# Patient Record
Sex: Male | Born: 1955 | Race: Black or African American | Hispanic: No | Marital: Single | Smoking: Current every day smoker
Health system: Southern US, Community
[De-identification: ages and names within clinical notes are randomized; demographics above are authoritative.]

## PROBLEM LIST (undated history)

## (undated) DIAGNOSIS — A159 Respiratory tuberculosis unspecified: Secondary | ICD-10-CM

## (undated) DIAGNOSIS — M25569 Pain in unspecified knee: Secondary | ICD-10-CM

## (undated) DIAGNOSIS — M19011 Primary osteoarthritis, right shoulder: Secondary | ICD-10-CM

## (undated) DIAGNOSIS — M1990 Unspecified osteoarthritis, unspecified site: Secondary | ICD-10-CM

## (undated) DIAGNOSIS — B1920 Unspecified viral hepatitis C without hepatic coma: Secondary | ICD-10-CM

## (undated) DIAGNOSIS — M109 Gout, unspecified: Secondary | ICD-10-CM

## (undated) HISTORY — PX: OTHER SURGICAL HISTORY: SHX169

---

## 1987-01-27 DIAGNOSIS — A159 Respiratory tuberculosis unspecified: Secondary | ICD-10-CM

## 1987-01-27 HISTORY — DX: Respiratory tuberculosis unspecified: A15.9

## 2002-04-23 ENCOUNTER — Inpatient Hospital Stay (HOSPITAL_COMMUNITY): Admission: EM | Admit: 2002-04-23 | Discharge: 2002-04-26 | Payer: Self-pay | Admitting: *Deleted

## 2002-11-21 ENCOUNTER — Emergency Department (HOSPITAL_COMMUNITY): Admission: EM | Admit: 2002-11-21 | Discharge: 2002-11-21 | Payer: Self-pay | Admitting: Emergency Medicine

## 2009-12-16 ENCOUNTER — Emergency Department (HOSPITAL_COMMUNITY): Admission: EM | Admit: 2009-12-16 | Discharge: 2009-12-16 | Payer: Self-pay | Admitting: Emergency Medicine

## 2010-01-22 ENCOUNTER — Emergency Department (HOSPITAL_COMMUNITY)
Admission: EM | Admit: 2010-01-22 | Discharge: 2010-01-22 | Payer: Self-pay | Source: Home / Self Care | Admitting: Emergency Medicine

## 2010-02-03 ENCOUNTER — Emergency Department (HOSPITAL_COMMUNITY)
Admission: EM | Admit: 2010-02-03 | Discharge: 2010-02-03 | Payer: Self-pay | Source: Home / Self Care | Admitting: Family Medicine

## 2010-04-08 LAB — CBC
HCT: 44.2 % (ref 39.0–52.0)
Hemoglobin: 14.6 g/dL (ref 13.0–17.0)
MCH: 27.3 pg (ref 26.0–34.0)
MCHC: 33 g/dL (ref 30.0–36.0)
MCV: 82.6 fL (ref 78.0–100.0)
Platelets: 182 10*3/uL (ref 150–400)
RBC: 5.35 MIL/uL (ref 4.22–5.81)
RDW: 13.5 % (ref 11.5–15.5)
WBC: 6.7 10*3/uL (ref 4.0–10.5)

## 2010-04-08 LAB — BASIC METABOLIC PANEL
BUN: 13 mg/dL (ref 6–23)
CO2: 27 mEq/L (ref 19–32)
Calcium: 8.9 mg/dL (ref 8.4–10.5)
Chloride: 99 mEq/L (ref 96–112)
Creatinine, Ser: 1 mg/dL (ref 0.4–1.5)
GFR calc Af Amer: >60 mL/min (ref >60)
GFR calc non Af Amer: >60 mL/min (ref >60)
Glucose, Bld: 104 mg/dL — ABNORMAL HIGH (ref 70–99)
Potassium: 4.3 mEq/L (ref 3.5–5.1)
Sodium: 134 mEq/L — ABNORMAL LOW (ref 135–145)

## 2010-04-08 LAB — DIFFERENTIAL
Basophils Absolute: 0 10*3/uL (ref 0.0–0.1)
Basophils Relative: 0 % (ref 0–1)
Eosinophils Absolute: 0.1 10*3/uL (ref 0.0–0.7)
Eosinophils Relative: 2 % (ref 0–5)
Lymphocytes Relative: 31 % (ref 12–46)
Lymphs Abs: 2.1 10*3/uL (ref 0.7–4.0)
Monocytes Absolute: 0.8 10*3/uL (ref 0.1–1.0)
Monocytes Relative: 12 % (ref 3–12)
Neutro Abs: 3.7 10*3/uL (ref 1.7–7.7)
Neutrophils Relative %: 55 % (ref 43–77)

## 2010-06-13 NOTE — Op Note (Signed)
   NAME:  Keith Deleon, Keith Deleon                           ACCOUNT NO.:  [id number]   MEDICAL RECORD NO.:  [id number]                   PATIENT TYPE:  INP   LOCATION:  5012                                 FACILITY:  [facility]   PHYSICIAN:  Artist Pais. Mina Marble, M.D.           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   PROCEDURE:  Irrigation and debridement of left index finger metacarpal  phalangeal joint.   SURGEON:  Artist Pais. Mina Marble, M.D.   ASSISTANT:  None.   ANESTHESIA:  General.   TOURNIQUET TIME:  Twenty-five minutes.   COMPLICATIONS:  None.   DRAINS:  None.   OPERATIVE REPORT:  The patient was taken to the operating room after  induction of general anesthesia.  The left upper extremity was prepped and  draped in the usual sterile fashion.  The arm was elevated for five minutes  and the tourniquet was inflated to 250 mmHg.  At this point in time a  longitudinal incision was made over the metacarpal phalangeal joint of the  left index finger, the incision was taken down through the skin and  subcutaneous tissues.  The metacarpal phalangeal joint was entered on the  radial side, purulent material was encountered, this was cultured, was  thoroughly irrigated with a liter of normal saline and 500 mm of antibiotic  impregnated in normal saline, it was loosely closed over a Xeroform packing  using 4-0 nylon.  The patient was then placed in a sterile dressing of  Xeroform, 4 x 4's, fluffs and a compressive hand dressing.  The patient  tolerated the procedure well and went to recovery in stable fashion.                                               Artist Pais Mina Marble, M.D.    MAW/MEDQ  D:   09/13/2002  T:  09/13/2002  Job:  [id number]

## 2010-06-13 NOTE — Op Note (Signed)
NAME:  MADDOX, HLAVATY                           ACCOUNT NO.:  [id number]   MEDICAL RECORD NO.:  [id number]                   PATIENT TYPE:  INP   LOCATION:  1827                                 FACILITY:  [facility]   PHYSICIAN:  Artist Pais. Mina Marble, M.D.           DATE OF BIRTH:  [birth year]   DATE OF PROCEDURE:  04/23/2002  DATE OF DISCHARGE:                                 OPERATIVE REPORT   PREOPERATIVE DIAGNOSIS:  Left index finger metacarpophalangeal joint septic  arthritis, status post spider bite.   POSTOPERATIVE DIAGNOSIS:  Left index finger metacarpophalangeal joint septic  arthritis, status post spider bite.   PROCEDURE:  Irrigation and debridement of left metacarpophalangeal joint,  left extensor tendon, and dorsum of the left hand.   SURGEON:  Artist Pais. Mina Marble, M.D.   ASSISTANT:  None.   ANESTHESIA:  General.   TOURNIQUET TIME:  30 minutes.   COMPLICATIONS:  None.  The wound was left packed open.   CULTURES:  Cultures x2 sent.   DESCRIPTION OF PROCEDURE:  The patient was taken to the operating room.  After the induction of adequate general anesthesia, the left upper extremity  was prepped and draped in usual sterile fashion.  The arm was elevated for  five minutes.  The tourniquet was then inflated to 250 mmHg.  At this point  in time, a longitudinal incision was made over the dorsal aspect of the  index and metacarpophalangeal joint extending from the PIP joint to the mid-  metacarpal level.  The incision was taken down through the skin and  subcutaneous tissues.  Purulent material was encountered immediately.  It  was cultured for both aerobic and anaerobic bacteria and after this was done  the flaps were elevated accordingly until normal tissue was identified.  A  complete debridement of all infected and devitalized tissue was undertaken  and the wound was then irrigated with 3 L of normal saline using a pulse  lavage, followed by a liter of antibiotic  impregnated normal saline,  followed by another 3 L of normal saline with a pulse lavage.  The  metacarpophalangeal joint was entered on the radial and ulnar side and there  was purulent material in there.  The joint itself was irrigated as well  during this procedure.  After this was done, a 1 x 8 Xeroform was placed in  the distal aspect of the wound and three 4-0 nylon sutures were used to  approximate the incision loosely.  It was then dressed in sterile dressing  of Xeroform, 4 x 4 fluffs, and compressive dressing and a Coban wrap.  The  patient tolerated the procedure well and went to the recovery room in stable  fashion.  Artist Pais Mina Marble, M.D.    MAW/MEDQ  D:  04/23/2002  T:  04/24/2002  Job:  [id number]
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
## 2010-06-13 NOTE — Discharge Summary (Signed)
NAME:  Keith Deleon, Keith Deleon                           ACCOUNT NO.:  [id number]   MEDICAL RECORD NO.:  [id number]                   PATIENT TYPE:  INP   LOCATION:  5012                                 FACILITY:  [facility]   PHYSICIAN:  Artist Pais. Mina Marble, M.D.           DATE OF BIRTH:  1955-01-29   DATE OF ADMISSION:  04/23/2002  DATE OF DISCHARGE:  04/26/2002                                 DISCHARGE SUMMARY   ADMISSION DIAGNOSIS:  Cellulitis of the left hand with left index finger  tendon sheath infection.   DISCHARGE DIAGNOSIS:  Cellulitis of the left hand with left index finger  tendon sheath infection.   OPERATION/PROCEDURE:  I&D of left hand for infection and tendon sheath  infection.   BRIEF HISTORY:  The patient is a 55 year old black male who presents with  redness, swelling, pain, tenderness of the left index finger, increasing  over the last few days, presents to the emergency room.  Dr. Dairl Ponder was consulted for evaluation and treatment.  He was admitted  through the emergency room, taken to the operating room for irrigation and  debridement, IV antibiotics, admission for observation, dressing changes and  IV antibiotics.   REVIEW OF SYSTEMS:  Negative.  He has had right knee surgery in the past.   SOCIAL HISTORY:  He has occasional alcohol use, positive tobacco use.  Vital  signs are stable.   ALLERGIES:  No known drug allergies.   CURRENT MEDICATIONS:  Currently, not on any medications.   HOSPITAL COURSE:  The patient was admitted through the emergency room, taken  to the operating room for irrigation and debridement of the left index  finger and left hand for cellulitis and infection.  Cultures were obtained.  The first day postoperatively, the patient was doing well.  Vital signs were  stable.  Dressing was dry.  There was questionable, a question of whether  the patient had a spider bite which developed an infection.  Occupational  therapy was ordered for  whirlpool, dressing changes, wet-to-dry saline  dressing changes, continued on IV antibiotics.  Cultures were pending.  Discharge planning was arranged on the second day postoperatively.  He  continued on IV Unasyn. Cultures were positive for gram-positive cocci.  Cultures ended up being positive for methicillin-resistant staph aureus. The  patient was ready for discharge home on 04/26/02.  Dressing was changed.  Wound was granulating well, decreased swelling, increased range of motion.  He was discontinued with his IV antibiotics, placed on p.o. antibiotics,  Augmentin 875 mg b.i.d.  He is going to do b.i.d. wet-to-dry dressing  changes.   DISCHARGE CONDITION:  Improved.   DISCHARGE DIAGNOSIS:  Left hand infection.   DISCHARGE MEDICATIONS:  Vicodin and Augmentin.    DISCHARGE INSTRUCTIONS:  Discharge home after dressing changes were taught  to the patient.  Follow up on Tuesday with Dr. Mina Marble.  Contact us prior  to  follow up if he has any questions or concerns.        Artist Pais Mina Marble, M.D.                 Artist Pais Mina Marble, M.D.    MAW/MEDQ  D:  09/21/2002  T:  09/22/2002  Job:  [id number]

## 2011-10-28 ENCOUNTER — Emergency Department (HOSPITAL_COMMUNITY): Payer: Medicaid Other

## 2011-10-28 ENCOUNTER — Encounter (HOSPITAL_COMMUNITY): Payer: Self-pay | Admitting: *Deleted

## 2011-10-28 ENCOUNTER — Emergency Department (HOSPITAL_COMMUNITY)
Admission: EM | Admit: 2011-10-28 | Discharge: 2011-10-28 | Disposition: A | Discharge status: Home / Self Care | Payer: Medicaid Other | Attending: Emergency Medicine | Admitting: Emergency Medicine

## 2011-10-28 DIAGNOSIS — M898X1 Other specified disorders of bone, shoulder: Secondary | ICD-10-CM

## 2011-10-28 DIAGNOSIS — R1310 Dysphagia, unspecified: Secondary | ICD-10-CM | POA: Insufficient documentation

## 2011-10-28 DIAGNOSIS — M899 Disorder of bone, unspecified: Secondary | ICD-10-CM | POA: Insufficient documentation

## 2011-10-28 DIAGNOSIS — M25419 Effusion, unspecified shoulder: Secondary | ICD-10-CM | POA: Insufficient documentation

## 2011-10-28 DIAGNOSIS — B1920 Unspecified viral hepatitis C without hepatic coma: Secondary | ICD-10-CM | POA: Insufficient documentation

## 2011-10-28 DIAGNOSIS — R079 Chest pain, unspecified: Secondary | ICD-10-CM | POA: Insufficient documentation

## 2011-10-28 DIAGNOSIS — F17200 Nicotine dependence, unspecified, uncomplicated: Secondary | ICD-10-CM | POA: Insufficient documentation

## 2011-10-28 DIAGNOSIS — M949 Disorder of cartilage, unspecified: Secondary | ICD-10-CM | POA: Insufficient documentation

## 2011-10-28 DIAGNOSIS — Z8611 Personal history of tuberculosis: Secondary | ICD-10-CM | POA: Insufficient documentation

## 2011-10-28 DIAGNOSIS — M25519 Pain in unspecified shoulder: Secondary | ICD-10-CM | POA: Insufficient documentation

## 2011-10-28 HISTORY — DX: Unspecified viral hepatitis C without hepatic coma: B19.20

## 2011-10-28 LAB — COMPREHENSIVE METABOLIC PANEL
ALT: 58 U/L — ABNORMAL HIGH (ref 0–53)
AST: 50 U/L — ABNORMAL HIGH (ref 0–37)
Albumin: 3.5 g/dL (ref 3.5–5.2)
Alkaline Phosphatase: 57 U/L (ref 39–117)
BUN: 13 mg/dL (ref 6–23)
CO2: 27 mEq/L (ref 19–32)
Calcium: 9.2 mg/dL (ref 8.4–10.5)
Chloride: 100 mEq/L (ref 96–112)
Creatinine, Ser: 1.01 mg/dL (ref 0.50–1.35)
GFR calc Af Amer: >90 mL/min (ref >90)
GFR calc non Af Amer: 81 mL/min — ABNORMAL LOW (ref >90)
Glucose, Bld: 136 mg/dL — ABNORMAL HIGH (ref 70–99)
Potassium: 4.1 mEq/L (ref 3.5–5.1)
Sodium: 138 mEq/L (ref 135–145)
Total Bilirubin: 0.4 mg/dL (ref 0.3–1.2)
Total Protein: 7.5 g/dL (ref 6.0–8.3)

## 2011-10-28 LAB — CBC WITH DIFFERENTIAL/PLATELET
Basophils Absolute: 0 10*3/uL (ref 0.0–0.1)
Basophils Relative: 0 % (ref 0–1)
Eosinophils Absolute: 0.1 10*3/uL (ref 0.0–0.7)
Eosinophils Relative: 1 % (ref 0–5)
HCT: 41.7 % (ref 39.0–52.0)
Hemoglobin: 14.2 g/dL (ref 13.0–17.0)
Lymphocytes Relative: 22 % (ref 12–46)
Lymphs Abs: 1.7 10*3/uL (ref 0.7–4.0)
MCH: 28.4 pg (ref 26.0–34.0)
MCHC: 34.1 g/dL (ref 30.0–36.0)
MCV: 83.4 fL (ref 78.0–100.0)
Monocytes Absolute: 0.9 10*3/uL (ref 0.1–1.0)
Monocytes Relative: 11 % (ref 3–12)
Neutro Abs: 4.8 10*3/uL (ref 1.7–7.7)
Neutrophils Relative %: 65 % (ref 43–77)
Platelets: 196 10*3/uL (ref 150–400)
RBC: 5 MIL/uL (ref 4.22–5.81)
RDW: 13.7 % (ref 11.5–15.5)
WBC: 7.5 10*3/uL (ref 4.0–10.5)

## 2011-10-28 LAB — TROPONIN I: Troponin I: <0.3 ng/mL (ref <0.30)

## 2011-10-28 MED ORDER — TRAMADOL HCL 50 MG PO TABS: 50.0000 mg | ORAL_TABLET | Freq: Four times a day (QID) | ORAL | Status: DC | PRN

## 2011-10-28 MED ORDER — HYDROCODONE-ACETAMINOPHEN 5-500 MG PO TABS: 1.0000 | ORAL_TABLET | Freq: Four times a day (QID) | ORAL | Status: DC | PRN

## 2011-10-28 MED ORDER — OXYCODONE-ACETAMINOPHEN 5-325 MG PO TABS
2.0000 | ORAL_TABLET | Freq: Once | ORAL | Status: AC
  Administered 2011-10-28: 2 via ORAL
  Filled 2011-10-28: qty 2
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
## 2011-10-28 NOTE — ED Notes (Signed)
Pt had right collarbone pain that started yesterday and then shot over to his left side and states now pain with swallowing and has pain trying to raise arm.  Pt denies injury, but states that it hurts to raise arm.

## 2011-10-28 NOTE — ED Provider Notes (Signed)
Pt complains of sharp collar bone pain.  Increases with movement of his right arm.  On exam ttp constochondral margin.  No diaphoresis on my exam.  EKG unremarkable.     Symptoms suggest musculoskeletal etiology.  Doubt ACS, PE, aortic dissection.  Medical screening examination/treatment/procedure(s) were conducted as a shared visit with non-physician practitioner(s) and myself.  I personally evaluated the patient during the encounter   Celene Kras, MD 10/29/11 [phone]

## 2011-10-28 NOTE — ED Notes (Signed)
Patient states he has a lump on his left anterior side that has been there for 7 years but seems to bother him more now.

## 2011-12-03 ENCOUNTER — Encounter (HOSPITAL_COMMUNITY): Payer: Self-pay | Admitting: Emergency Medicine

## 2011-12-03 ENCOUNTER — Emergency Department (HOSPITAL_COMMUNITY)
Admission: EM | Admit: 2011-12-03 | Discharge: 2011-12-04 | Disposition: A | Discharge status: Home / Self Care | Payer: Medicaid Other | Attending: Emergency Medicine | Admitting: Emergency Medicine

## 2011-12-03 ENCOUNTER — Emergency Department (HOSPITAL_COMMUNITY): Payer: Medicaid Other

## 2011-12-03 DIAGNOSIS — F17200 Nicotine dependence, unspecified, uncomplicated: Secondary | ICD-10-CM | POA: Insufficient documentation

## 2011-12-03 DIAGNOSIS — Z8619 Personal history of other infectious and parasitic diseases: Secondary | ICD-10-CM | POA: Insufficient documentation

## 2011-12-03 DIAGNOSIS — G8929 Other chronic pain: Secondary | ICD-10-CM | POA: Insufficient documentation

## 2011-12-03 DIAGNOSIS — M1120 Other chondrocalcinosis, unspecified site: Secondary | ICD-10-CM | POA: Insufficient documentation

## 2011-12-03 LAB — SYNOVIAL CELL COUNT + DIFF, W/ CRYSTALS
Lymphocytes-Synovial Fld: 7 % (ref 0–20)
Monocyte-Macrophage-Synovial Fluid: 13 % — ABNORMAL LOW (ref 50–90)

## 2011-12-03 LAB — GRAM STAIN

## 2011-12-03 MED ORDER — OXYCODONE-ACETAMINOPHEN 5-325 MG PO TABS
1.0000 | ORAL_TABLET | Freq: Once | ORAL | Status: AC
  Administered 2011-12-03: 1 via ORAL
  Filled 2011-12-03: qty 1

## 2011-12-03 NOTE — ED Notes (Signed)
Patient transported to X-ray 

## 2011-12-03 NOTE — ED Notes (Signed)
Pt c/o right knee pain (hx of chronic pain since surgery in '96) sharp inc this am. Pt states "i can hardly even bend it, my pain is at an 8." Pt states pain is throbbing. Minor swelling noted, no deformity. Old surgical scar noted.

## 2011-12-03 NOTE — ED Notes (Signed)
Pt c/o right knee pain and swelling starting today; pt denies obvious injury

## 2011-12-03 NOTE — ED Provider Notes (Signed)
History     CSN: [id number]  Arrival date & time 12/03/11  1826   First MD Initiated Contact with Patient 12/03/11 1914      Chief Complaint  Patient presents with  . Knee Pain    (Consider location/radiation/quality/duration/timing/severity/associated sxs/prior treatment) HPI History provided by pt.   Pt has a h/o cartilage tear in R knee, for which he had surgery in 1989.  Has had chronic pain ever since.  Pain acutely worsened this morning.  Associated w/ edema and aggravated by flexion and bearing weight.  Denies fever.  Denies new trauma.  Pt has h/o hep C but is otherwise healthy; no h/o gout.   Past Medical History  Diagnosis Date  . Hepatitis C     Past Surgical History  Procedure Date  . Right knee surgery     History reviewed. No pertinent family history.  History  Substance Use Topics  . Smoking status: Current Every Day Smoker  . Smokeless tobacco: Not on file  . Alcohol Use: Yes     Comment: occ      Review of Systems  All other systems reviewed and are negative.    Allergies  Penicillins  Home Medications   Current Outpatient Rx  Name  Route  Sig  Dispense  Refill  . TRAMADOL HCL 50 MG PO TABS   Oral   Take 50-100 mg by mouth every 6 (six) hours as needed. For pain           BP 143/89  Pulse 80  Temp 98 F (36.7 C) (Oral)  Resp 18  SpO2 100%  Physical Exam  Nursing note and vitals reviewed. Constitutional: He is oriented to person, place, and time. He appears well-developed and well-nourished. No distress.       Pt attempting to move from chair to stretcher when I came into room.  He is having difficulty bearing weight on RLE and appears very uncomfortable.   HENT:  Head: Normocephalic and atraumatic.  Eyes:       Normal appearance  Neck: Normal range of motion.  Pulmonary/Chest: Effort normal.  Musculoskeletal: Normal range of motion.       Right knee edematous and warm to the touch.  No erythema or other skin changes.   Tenderness medial and lateral to patella.  Holding in approx 30deg flexion and passive ROM limited by pain.  2+ DP pulse and distal sensation intact.    Neurological: He is alert and oriented to person, place, and time.  Psychiatric: He has a normal mood and affect. His behavior is normal.    ED Course  Apiration of blood/fluid Date/Time: 12/03/2011 9:48 PM Performed by: Otilio Miu Authorized by: Ruby Cola E Consent: Verbal consent obtained. Consent given by: patient Preparation: Patient was prepped and draped in the usual sterile fashion. Local anesthesia used: yes Anesthesia: local infiltration Local anesthetic: lidocaine 2% with epinephrine Anesthetic total: 10 ml Patient tolerance: Patient tolerated the procedure well with no immediate complications. Comments: 90cc synovial fluid removed from R knee   (including critical care time)  Labs Reviewed - No data to display Dg Knee Complete 4 Views Right  12/03/2011  *RADIOLOGY REPORT*  Clinical Data: Right knee pain and swelling.  No recent injury.  RIGHT KNEE - COMPLETE 4+ VIEW  Comparison: None.  Findings: Joint space narrowing and spur formation involving all three joint compartments.  Also noted are synovial calcifications. There are also loose bodies medial to the medial joint space. Moderate  sized effusion.  IMPRESSION: Tricompartmental degenerative changes, synovial chondromatosis, loose bodies and moderate sized effusion.   Original Report Authenticated By: Beckie Salts, M.D.      1. Pseudogout       MDM  56yo M w/ h/o hep C present w/ acute on chronic, non-traumatic R knee edema and pain.  Xray shows arthritis and moderate joint effusion.  90cc synovial fluid aspirated from joint and sent for cell count, gram stain, culture and crystals. Pt to receive 1 percocet for pain.  9:49 PM   Based on WBC count, lack of organisms and presence of calcium pyrophosphate crystals, it appears that patient has  pseudogout.  Discussed w/ him.  Ortho tech provided him w/ knee immobilizer.  Patient d/c'd home w/ percocet for pain and advised to f/u with ortho.  Strict return precautions discussed. 6:12 AM        Otilio Miu, PA 12/04/11 [phone]

## 2011-12-03 NOTE — ED Notes (Signed)
Alert, NAD, calm, interactive, ambulating around room with one crutch, pending lab results and disposition, pt updated.

## 2011-12-04 LAB — PATHOLOGIST SMEAR REVIEW

## 2011-12-04 MED ORDER — OXYCODONE-ACETAMINOPHEN 5-325 MG PO TABS: 1.0000 | ORAL_TABLET | ORAL | Status: DC | PRN

## 2011-12-04 MED ORDER — IBUPROFEN 800 MG PO TABS: 800.0000 mg | ORAL_TABLET | Freq: Three times a day (TID) | ORAL | Status: DC

## 2011-12-04 MED ORDER — OXYCODONE-ACETAMINOPHEN 5-325 MG PO TABS
1.0000 | ORAL_TABLET | Freq: Once | ORAL | Status: AC
  Administered 2011-12-04: 1 via ORAL
  Filled 2011-12-04: qty 1

## 2011-12-04 NOTE — ED Notes (Signed)
[facility]otech at [facility] for immobilizer placement.

## 2011-12-04 NOTE — ED Notes (Signed)
EDPA into room 

## 2011-12-05 NOTE — ED Provider Notes (Signed)
Medical screening examination/treatment/procedure(s) were performed by non-physician practitioner and as supervising physician I was immediately available for consultation/collaboration.    Celene Kras, MD 12/05/11 1600

## 2011-12-07 LAB — BODY FLUID CULTURE: Culture: NO GROWTH

## 2013-04-11 ENCOUNTER — Emergency Department (HOSPITAL_COMMUNITY): Payer: Medicaid Other

## 2013-04-11 ENCOUNTER — Emergency Department (HOSPITAL_COMMUNITY)
Admission: EM | Admit: 2013-04-11 | Discharge: 2013-04-11 | Disposition: A | Discharge status: Home / Self Care | Payer: Medicaid Other | Attending: Emergency Medicine | Admitting: Emergency Medicine

## 2013-04-11 ENCOUNTER — Encounter (HOSPITAL_COMMUNITY): Payer: Self-pay | Admitting: Emergency Medicine

## 2013-04-11 DIAGNOSIS — F17200 Nicotine dependence, unspecified, uncomplicated: Secondary | ICD-10-CM | POA: Insufficient documentation

## 2013-04-11 DIAGNOSIS — M25511 Pain in right shoulder: Secondary | ICD-10-CM

## 2013-04-11 DIAGNOSIS — S46909A Unspecified injury of unspecified muscle, fascia and tendon at shoulder and upper arm level, unspecified arm, initial encounter: Secondary | ICD-10-CM | POA: Insufficient documentation

## 2013-04-11 DIAGNOSIS — M1990 Unspecified osteoarthritis, unspecified site: Secondary | ICD-10-CM

## 2013-04-11 DIAGNOSIS — M129 Arthropathy, unspecified: Secondary | ICD-10-CM | POA: Insufficient documentation

## 2013-04-11 DIAGNOSIS — R42 Dizziness and giddiness: Secondary | ICD-10-CM | POA: Insufficient documentation

## 2013-04-11 DIAGNOSIS — F1020 Alcohol dependence, uncomplicated: Secondary | ICD-10-CM | POA: Insufficient documentation

## 2013-04-11 DIAGNOSIS — Y9389 Activity, other specified: Secondary | ICD-10-CM | POA: Insufficient documentation

## 2013-04-11 DIAGNOSIS — Z880 Allergy status to penicillin: Secondary | ICD-10-CM | POA: Insufficient documentation

## 2013-04-11 DIAGNOSIS — Y9210 Unspecified residential institution as the place of occurrence of the external cause: Secondary | ICD-10-CM | POA: Insufficient documentation

## 2013-04-11 DIAGNOSIS — W2209XA Striking against other stationary object, initial encounter: Secondary | ICD-10-CM | POA: Insufficient documentation

## 2013-04-11 DIAGNOSIS — R61 Generalized hyperhidrosis: Secondary | ICD-10-CM | POA: Insufficient documentation

## 2013-04-11 DIAGNOSIS — S4980XA Other specified injuries of shoulder and upper arm, unspecified arm, initial encounter: Secondary | ICD-10-CM | POA: Insufficient documentation

## 2013-04-11 DIAGNOSIS — G8929 Other chronic pain: Secondary | ICD-10-CM | POA: Insufficient documentation

## 2013-04-11 DIAGNOSIS — E860 Dehydration: Secondary | ICD-10-CM | POA: Insufficient documentation

## 2013-04-11 DIAGNOSIS — R110 Nausea: Secondary | ICD-10-CM | POA: Insufficient documentation

## 2013-04-11 DIAGNOSIS — Z8619 Personal history of other infectious and parasitic diseases: Secondary | ICD-10-CM | POA: Insufficient documentation

## 2013-04-11 LAB — COMPREHENSIVE METABOLIC PANEL WITH GFR
ALT: 56 U/L — ABNORMAL HIGH (ref 0–53)
AST: 42 U/L — ABNORMAL HIGH (ref 0–37)
Albumin: 4 g/dL (ref 3.5–5.2)
Alkaline Phosphatase: 73 U/L (ref 39–117)
BUN: 11 mg/dL (ref 6–23)
CO2: 24 meq/L (ref 19–32)
Calcium: 8.9 mg/dL (ref 8.4–10.5)
Chloride: 97 meq/L (ref 96–112)
Creatinine, Ser: 0.95 mg/dL (ref 0.50–1.35)
GFR calc Af Amer: >90 mL/min
GFR calc non Af Amer: >90 mL/min
Glucose, Bld: 132 mg/dL — ABNORMAL HIGH (ref 70–99)
Potassium: 4.1 meq/L (ref 3.7–5.3)
Sodium: 137 meq/L (ref 137–147)
Total Bilirubin: 0.6 mg/dL (ref 0.3–1.2)
Total Protein: 7.5 g/dL (ref 6.0–8.3)

## 2013-04-11 LAB — CBC WITH DIFFERENTIAL/PLATELET
BASOS ABS: 0 10*3/uL (ref 0.0–0.1)
Basophils Relative: 0 % (ref 0–1)
EOS PCT: 1 % (ref 0–5)
Eosinophils Absolute: 0.1 10*3/uL (ref 0.0–0.7)
HEMATOCRIT: 42.6 % (ref 39.0–52.0)
Hemoglobin: 14.5 g/dL (ref 13.0–17.0)
LYMPHS PCT: 14 % (ref 12–46)
Lymphs Abs: 1.1 10*3/uL (ref 0.7–4.0)
MCH: 28.2 pg (ref 26.0–34.0)
MCHC: 34 g/dL (ref 30.0–36.0)
MCV: 82.7 fL (ref 78.0–100.0)
MONO ABS: 1 10*3/uL (ref 0.1–1.0)
Monocytes Relative: 13 % — ABNORMAL HIGH (ref 3–12)
Neutro Abs: 5.5 10*3/uL (ref 1.7–7.7)
Neutrophils Relative %: 72 % (ref 43–77)
Platelets: 190 10*3/uL (ref 150–400)
RBC: 5.15 MIL/uL (ref 4.22–5.81)
RDW: 14.1 % (ref 11.5–15.5)
WBC: 7.7 10*3/uL (ref 4.0–10.5)

## 2013-04-11 LAB — TROPONIN I: Troponin I: <0.3 ng/mL

## 2013-04-11 LAB — URINALYSIS, ROUTINE W REFLEX MICROSCOPIC
Bilirubin Urine: NEGATIVE
Glucose, UA: NEGATIVE mg/dL
Hgb urine dipstick: NEGATIVE
Ketones, ur: NEGATIVE mg/dL
LEUKOCYTES UA: NEGATIVE
NITRITE: NEGATIVE
PROTEIN: NEGATIVE mg/dL
Specific Gravity, Urine: 1.016 (ref 1.005–1.030)
UROBILINOGEN UA: 1 mg/dL (ref 0.0–1.0)
pH: 6.5 (ref 5.0–8.0)

## 2013-04-11 LAB — RAPID URINE DRUG SCREEN, HOSP PERFORMED
Amphetamines: NOT DETECTED
Barbiturates: NOT DETECTED
Benzodiazepines: NOT DETECTED
Cocaine: POSITIVE — AB
OPIATES: NOT DETECTED
Tetrahydrocannabinol: POSITIVE — AB

## 2013-04-11 LAB — ETHANOL: Alcohol, Ethyl (B): <11 mg/dL (ref 0–11)

## 2013-04-11 MED ORDER — SODIUM CHLORIDE 0.9 % IV BOLUS (SEPSIS)
1000.0000 mL | Freq: Once | INTRAVENOUS | Status: AC
  Administered 2013-04-11: 1000 mL via INTRAVENOUS

## 2013-04-11 MED ORDER — MELOXICAM 7.5 MG PO TABS
7.5000 mg | ORAL_TABLET | Freq: Every day | ORAL | Status: DC
Start: 2013-04-11 — End: 2013-11-01

## 2013-04-11 MED ORDER — ACETAMINOPHEN 325 MG PO TABS
650.0000 mg | ORAL_TABLET | Freq: Once | ORAL | Status: AC
  Administered 2013-04-11: 650 mg via ORAL
  Filled 2013-04-11: qty 2

## 2013-04-11 NOTE — ED Notes (Signed)
PA at bedside.
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
## 2013-04-11 NOTE — ED Notes (Signed)
Pt comfortable with discharge and follow up instructions. Presriptions x1
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
## 2013-04-11 NOTE — ED Notes (Signed)
Notified pt of orders for further imaging.

## 2013-04-11 NOTE — ED Notes (Signed)
Pt presents from home via [facility] with c/o near syncopal episode that occurred at approx 0830 today. Pt reports he woke up feeling "weak all over" today and became dizzy and "almost passed out". Pt accidentally hit right shoulder on a wall during the episode - pt reports has chronic pain in this shoulder and hitting the wall made the pain unbearable. Lungs CTA, denies CP

## 2013-04-11 NOTE — Discharge Instructions (Signed)
Please call and set-up an appointment with [facility] Please call and set-up an appointment with Orthopedics regarding right shoulder pain  Please rest and stay hydrated - please drink plenty of water Please decrease alcohol intake for alcohol withdrawls can be fatal Please take medications as prescribed Please wear sling for comfort Please massage right shoulder with icy hot ointment and perform slow circular motions Please continue to monitor symptoms closely and if symptoms are to worsen or change (fever greater than 101, chills, sweating, nausea, vomiting, dizziness, headache, blurred vision, fall, injury, chest pain, shortness of breath, difficulty breathing, hearing things no one else hears or seeing things no one else sees) please report back to the ED  Alcohol Problems Most adults who drink alcohol drink in moderation (not a lot) are at low risk for developing problems related to their drinking. However, all drinkers, including low-risk drinkers, should know about the health risks connected with drinking alcohol. RECOMMENDATIONS FOR LOW-RISK DRINKING  Drink in moderation. Moderate drinking is defined as follows:   Men - no more than 2 drinks per day.  Nonpregnant women - no more than 1 drink per day.  Over age 81 - no more than 1 drink per day. A standard drink is 12 grams of pure alcohol, which is equal to a 12 ounce bottle of beer or wine cooler, a 5 ounce glass of wine, or 1.5 ounces of distilled spirits (such as whiskey, brandy, vodka, or rum).  ABSTAIN FROM (DO NOT DRINK) ALCOHOL:  When pregnant or considering pregnancy.  When taking a medication that interacts with alcohol.  If you are alcohol dependent.  A medical condition that prohibits drinking alcohol (such as ulcer, liver disease, or heart disease). DISCUSS WITH YOUR CAREGIVER:  If you are at risk for coronary heart disease, discuss the potential benefits and risks of alcohol use: Light to moderate  drinking is associated with lower rates of coronary heart disease in certain populations (for example, men over age 61 and postmenopausal women). Infrequent or nondrinkers are advised not to begin light to moderate drinking to reduce the risk of coronary heart disease so as to avoid creating an alcohol-related problem. Similar protective effects can likely be gained through proper diet and exercise.  Women and the elderly have smaller amounts of body water than men. As a result women and the elderly achieve a higher blood alcohol concentration after drinking the same amount of alcohol.  Exposing a fetus to alcohol can cause a broad range of birth defects referred to as Fetal Alcohol Syndrome (FAS) or Alcohol-Related Birth Defects (ARBD). Although FAS/ARBD is connected with excessive alcohol consumption during pregnancy, studies also have reported neurobehavioral problems in infants born to mothers reporting drinking an average of 1 drink per day during pregnancy.  Heavier drinking (the consumption of more than 4 drinks per occasion by men and more than 3 drinks per occasion by women) impairs learning (cognitive) and psychomotor functions and increases the risk of alcohol-related problems, including accidents and injuries. CAGE QUESTIONS:   Have you ever felt that you should Cut down on your drinking?  Have people Annoyed you by criticizing your drinking?  Have you ever felt bad or Guilty about your drinking?  Have you ever had a drink first thing in the morning to steady your nerves or get rid of a hangover (Eye opener)? If you answered positively to any of these questions: You may be at risk for alcohol-related problems if alcohol consumption is:   Men:  Greater than 14 drinks per week or more than 4 drinks per occasion.  Women: Greater than 7 drinks per week or more than 3 drinks per occasion. Do you or your family have a medical history of alcohol-related problems, such  as:  Blackouts.  Sexual dysfunction.  Depression.  Trauma.  Liver dysfunction.  Sleep disorders.  Hypertension.  Chronic abdominal pain.  Has your drinking ever caused you problems, such as problems with your family, problems with your work (or school) performance, or accidents/injuries?  Do you have a compulsion to drink or a preoccupation with drinking?  Do you have poor control or are you unable to stop drinking once you have started?  Do you have to drink to avoid withdrawal symptoms?  Do you have problems with withdrawal such as tremors, nausea, sweats, or mood disturbances?  Does it take more alcohol than in the past to get you high?  Do you feel a strong urge to drink?  Do you change your plans so that you can have a drink?  Do you ever drink in the morning to relieve the shakes or a hangover? If you have answered a number of the previous questions positively, it may be time for you to talk to your caregivers, family, and friends and see if they think you have a problem. Alcoholism is a chemical dependency that keeps getting worse and will eventually destroy your health and relationships. Many alcoholics end up dead, impoverished, or in prison. This is often the end result of all chemical dependency.  Do not be discouraged if you are not ready to take action immediately.  Decisions to change behavior often involve up and down desires to change and feeling like you cannot decide.  Try to think more seriously about your drinking behavior.  Think of the reasons to quit. WHERE TO GO FOR ADDITIONAL INFORMATION   The National Institute on Alcohol Abuse and Alcoholism (NIAAA) BasicStudents.dk  [employer] on Alcoholism and Drug Dependence (NCADD) www.ncadd.org  American Society of Addiction Medicine (ASAM) RoyalDiary.gl  Document Released: 01/12/2005 Document Revised: 04/06/2011 Document Reviewed: 08/31/2007 [facility] Patient Information 2014 [place],  [place]. Arthritis, Nonspecific Arthritis is inflammation of a joint. This usually means pain, redness, warmth or swelling are present. One or more joints may be involved. There are a number of types of arthritis. Your caregiver may not be able to tell what type of arthritis you have right away. CAUSES  The most common cause of arthritis is the wear and tear on the joint (osteoarthritis). This causes damage to the cartilage, which can break down over time. The knees, hips, back and neck are most often affected by this type of arthritis. Other types of arthritis and common causes of joint pain include:  Sprains and other injuries near the joint. Sometimes minor sprains and injuries cause pain and swelling that develop hours later.  Rheumatoid arthritis. This affects hands, feet and knees. It usually affects both sides of your body at the same time. It is often associated with chronic ailments, fever, weight loss and general weakness.  Crystal arthritis. Gout and pseudo gout can cause occasional acute severe pain, redness and swelling in the foot, ankle, or knee.  Infectious arthritis. Bacteria can get into a joint through a break in overlying skin. This can cause infection of the joint. Bacteria and viruses can also spread through the blood and affect your joints.  Drug, infectious and allergy reactions. Sometimes joints can become mildly painful and slightly swollen with these types  of illnesses. SYMPTOMS   Pain is the main symptom.  Your joint or joints can also be red, swollen and warm or hot to the touch.  You may have a fever with certain types of arthritis, or even feel overall ill.  The joint with arthritis will hurt with movement. Stiffness is present with some types of arthritis. DIAGNOSIS  Your caregiver will suspect arthritis based on your description of your symptoms and on your exam. Testing may be needed to find the type of arthritis:  Blood and sometimes urine tests.  X-ray  tests and sometimes CT or MRI scans.  Removal of fluid from the joint (arthrocentesis) is done to check for bacteria, crystals or other causes. Your caregiver (or a specialist) will numb the area over the joint with a local anesthetic, and use a needle to remove joint fluid for examination. This procedure is only minimally uncomfortable.  Even with these tests, your caregiver may not be able to tell what kind of arthritis you have. Consultation with a specialist (rheumatologist) may be helpful. TREATMENT  Your caregiver will discuss with you treatment specific to your type of arthritis. If the specific type cannot be determined, then the following general recommendations may apply. Treatment of severe joint pain includes:  Rest.  Elevation.  Anti-inflammatory medication (for example, ibuprofen) may be prescribed. Avoiding activities that cause increased pain.  Only take over-the-counter or prescription medicines for pain and discomfort as recommended by your caregiver.  Cold packs over an inflamed joint may be used for 10 to 15 minutes every hour. Hot packs sometimes feel better, but do not use overnight. Do not use hot packs if you are diabetic without your caregiver's permission.  A cortisone shot into arthritic joints may help reduce pain and swelling.  Any acute arthritis that gets worse over the next 1 to 2 days needs to be looked at to be sure there is no joint infection. Long-term arthritis treatment involves modifying activities and lifestyle to reduce joint stress jarring. This can include weight loss. Also, exercise is needed to nourish the joint cartilage and remove waste. This helps keep the muscles around the joint strong. HOME CARE INSTRUCTIONS   Do not take aspirin to relieve pain if gout is suspected. This elevates uric acid levels.  Only take over-the-counter or prescription medicines for pain, discomfort or fever as directed by your caregiver.  Rest the joint as much as  possible.  If your joint is swollen, keep it elevated.  Use crutches if the painful joint is in your leg.  Drinking plenty of fluids may help for certain types of arthritis.  Follow your caregiver's dietary instructions.  Try low-impact exercise such as:  Swimming.  Water aerobics.  Biking.  Walking.  Morning stiffness is often relieved by a warm shower.  Put your joints through regular range-of-motion. SEEK MEDICAL CARE IF:   You do not feel better in 24 hours or are getting worse.  You have side effects to medications, or are not getting better with treatment. SEEK IMMEDIATE MEDICAL CARE IF:   You have a fever.  You develop severe joint pain, swelling or redness.  Many joints are involved and become painful and swollen.  There is severe back pain and/or leg weakness.  You have loss of bowel or bladder control. Document Released: 02/20/2004 Document Revised: 04/06/2011 Document Reviewed: 03/07/2008 [facility] Patient Information 2014 [place], [place].   Emergency Department Resource Guide 1) Find a Doctor and Pay Out of Pocket Although you won't  have to find out who is covered by your insurance plan, it is a good idea to ask around and get recommendations. You will then need to call the office and see if the doctor you have chosen will accept you as a new patient and what types of options they offer for patients who are self-pay. Some doctors offer discounts or will set up payment plans for their patients who do not have insurance, but you will need to ask so you aren't surprised when you get to your appointment.  2) Contact Your Local Health Department Not all health departments have doctors that can see patients for sick visits, but many do, so it is worth a call to see if yours does. If you don't know where your local health department is, you can check in your phone book. The CDC also has a tool to help you locate your state's health department, and many state  websites also have listings of all of their local health departments.  3) Find a Walk-in Clinic If your illness is not likely to be very severe or complicated, you may want to try a walk in clinic. These are popping up all over the country in pharmacies, drugstores, and shopping centers. They're usually staffed by nurse practitioners or physician assistants that have been trained to treat common illnesses and complaints. They're usually fairly quick and inexpensive. However, if you have serious medical issues or chronic medical problems, these are probably not your best option.  No Primary Care Doctor: - Call Health Connect at  [phone] - they can help you locate a primary care doctor that  accepts your insurance, provides certain services, etc. - Physician Referral Service- [phone]  Chronic Pain Problems: Organization         Address  Phone   Notes  [facility]  [phone] Patients need to be referred by their primary care doctor.   Medication Assistance: Organization         Address  Phone   Notes  [facility] Medication [facility] [address] [place] [address] [place], [place] [address] [phone] --Must be a resident of [facility] -- Must have NO insurance coverage whatsoever (no Medicaid/ Medicare, etc.) -- The pt. MUST have a primary care doctor that directs their care regularly and follows them in the community   [facility]ist  [phone]   [employer]  [phone]    Agencies that provide inexpensive medical care: Organization         Address  Phone   Notes  [facility]  [phone]   [facility]    [phone]   [facility] [address] [place], [place] [address] [phone]   [facility] [address], [place] [phone]   [facility]anned Parenthood    [phone]   [facility]    [phone]   [facility]  [address], [place] Phone:  [phone], Fax:  [phone] Hours of Operation:  9 am - 6 pm, M-F.  Also accepts Medicaid/Medicare and self-pay.  [facility]  [address],  Phone: [phone], Fax: [phone]. Hours of Operation:  8:30 am - 5:30 pm, M-F.  Also accepts Medicaid and self-pay.  [facility] [place] [address] [employer] Phone: [phone]   [facility] [address] [place], [place] [phone], Ext. [phone] Mondays & Thursdays: 7-9 AM.  First 15 patients are seen on a first come, first serve basis.    Medicaid-accepting [facility] Providers:  Organization         Address  Phone   Notes  [facility] [address], [place] [phone] Also accepts self-pay patients.  [facility] [address] Laurell Josephs [place], [place]  [phone]   [facility] [address], [place] [phone]   [facility] [address], [place] [phone]   Renaye Rakers [address], [place]   [phone] Only accepts [place] patients after they have their name applied to their card.   Self-Pay (no insurance) in [facility]:  Organization         Address  Phone   Notes  Sickle Cell Patients, [facility] [address] [place], [place] [phone]   [facility] [address] [place], [place] [phone]   [facility] [place]  [address], [place] [phone]   [facility]/Dr. Osei-Bonsu  [address] [place] or [address], [place] [phone] Phone number for both [place] and [place] locations is the same.  [facility] [address], [place] [phone]   [facility] [address] [place] or [address] [phone]   [facility] [address] [place] [phone], phone; [phone], fax Sees patients 1st and 3rd Saturday of every month.  Must not qualify for public or private insurance (i.e. Medicaid, Medicare, [place] Health Choice, Veterans' Benefits)  Household income should be no more than 200% of the poverty level The clinic cannot treat you if you are pregnant or think you are pregnant  Sexually transmitted diseases are not treated at the clinic.    Dental Care: Organization         Address  Phone  Notes  [facility] Department of [facility] [address] [place], [place] [phone] Accepts children up to age 41 who are enrolled in [place] or [place] Health Choice; pregnant women with a Medicaid card; and children who have applied for Medicaid or [place] Health Choice, but were declined, whose parents can pay a reduced fee at time of service.  [facility] Department of [facility]  [address], [place] [phone] Accepts children up to age 76 who are enrolled in [place] or [place] Health Choice; pregnant women with a Medicaid card; and children who have applied for Medicaid or [place] Health Choice, but were declined, whose parents can pay a reduced fee at time of service.  [facility]  [address] [place], [place] [phone] Patients are seen by appointment only. Walk-ins are not accepted. [facility] will see patients 59 years of age and older. Monday - Tuesday (8am-5pm) Most Wednesdays (8:30-5pm) $30 per visit, cash only  [facility] Adult Dental Access PRO[facility]  [address], [facility] [phone] Patients are seen by appointment only. Walk-ins are not accepted. [facility] will see patients 29 years of age and older. One Wednesday Evening (Monthly: Volunteer Based).  $30 per visit, cash only  [employer] of  Dentistry Clinics  [phone] for adults; Children under age 474, call Graduate Pediatric Dentistry at [phone]. Children aged 104-14, please call [phone] to request a pediatric application.  Dental services are provided in all areas of dental care including fillings, crowns and bridges, complete and partial dentures, implants, gum treatment, root canals, and extractions. Preventive care is also provided. Treatment is provided to both adults and children. Patients are selected via a lottery and there is often a waiting list.   [facility] [address], [place]  [phone] [email or web address]   [facility] [address], [place], [place] [phone], Ext. [phone] Second and Fourth Thursday of each month, opens at 6:30 AM; Clinic ends at 9 AM.  Patients are seen on a first-come first-served basis, and a limited number are seen during each clinic.   [facility]  [address], [place], [place] [phone]   Eligibility Requirements You must have lived in [place], [place], or [place] counties for at least the last three months.   You cannot be eligible for state or federal sponsored [place]healthcare insurance, including [employer]IG[employer]eterans Administration, [place]icaid, or [employer]icare.   You generally cannot be eligible for healthcare insurance through your employer.    How to apply: Eligibility screenings are held every Tuesday and Wednesday afternoon from 1:00 pm until 4:00 pm. You do not need an appointment for the interview!  [facility] [address], [place], [place] [phone]   [facility]  [phone]   [facility]  [phone]   [facility]  [phone]    Behavioral Health Resources in the Community: Intensive Outpatient Programs Organization         Address  Phone  Notes  [facility] [address], [place], [place]  [phone]   [facility] [address], [place], [place] [phone]   [facility] [address], [place], [place]  [phone]   [facility] [address],  [place], [place] [phone] or [phone]   Substance Abuse Resources Organization         Address  Phone  Notes  [facility]  [phone]   [facility]  [phone]   [facility]  [phone]   [facility]  [phone]   Residential & Outpatient Substance Abuse Program  [phone]   Psychological Services Organization         Address  Phone  Notes  [facility]  [phone]- [phone]   [facility]  [phone]- [phone]   [facility] [address], [place] [phone] or [phone]    Mobile Crisis Teams Organization         Address  Phone  Notes  [facility], Mobile Crisis Care Unit  [phone]   [facility]  [address] [place], [place] [phone]   Doristine LocksSharon DeEsch [address] [place] [phone]    Self-Help/Support Groups Organization         Address  Phone             Notes  [facility] [place] - variety of support groups  [phone] Call for more information  Narcotics Anonymous (NA), [facility] [address], [place]  2 meetings at this location   [facility] Organization         Address  Phone  Notes  [facility] [address]    [place]  [phone]   [facility]  [address], [place] [address], [place], [place] [phone]   [facility] [address] [place], [place] [phone] Admissions: 8am-3pm M-F  [facility] [address]    [place], [place] [phone]   [facility] [address] [place], [place], [place] [phone]   [facility] [address],   [place], [place] [phone]   [facility] - Intensive Outpatient [address], [place], [place] [phone]   [facility] ([facility] [address] [place] [place], [place] [phone] or [phone]   [facility] ([facility]) [address] [place], [place] [phone] Accepts Medicaid  [facility] [address],  [place] [phone] Substance Abuse/Addiction Treatment   [facility] Organization         Address  Phone  Notes  [facility]  [phone]   Angie Fava, PhD [address] Ervin Knack [place], [place]   [phone] or [phone]   [facility]   [address] [place], [place] [phone]   [facility] [address], [place], [place] [phone] Insurance/Medicaid/sponsorship through [facility] and Families [address]                                    [place], [place] [phone] Therapy/tele-psych/case  [facility] [address] [place], [place] [phone]    Dr. Lolly Mustache  [phone]   [facility]  [employer] [facility] Dept. 1) [address] [place] 2) [address], [place] 3)  [address], [place] [phone]  [phone]   [facility] [place] Child Abuse Hotline [phone] or [phone] (After Hours)

## 2013-04-11 NOTE — ED Notes (Signed)
Ambulated Pt in the hall. Pt had no issues.

## 2013-08-23 ENCOUNTER — Other Ambulatory Visit: Payer: Self-pay | Admitting: Orthopedic Surgery

## 2013-08-23 DIAGNOSIS — M25511 Pain in right shoulder: Secondary | ICD-10-CM

## 2013-08-28 ENCOUNTER — Ambulatory Visit
Admission: RE | Admit: 2013-08-28 | Discharge: 2013-08-28 | Disposition: A | Discharge status: Home / Self Care | Payer: Medicaid Other | Source: Ambulatory Visit | Attending: Orthopedic Surgery | Admitting: Orthopedic Surgery

## 2013-08-28 DIAGNOSIS — M25511 Pain in right shoulder: Secondary | ICD-10-CM

## 2013-10-17 NOTE — Pre-Procedure Instructions (Addendum)
Keith Deleon  10/17/2013   Your procedure is scheduled on:  11/01/03  Report to [facility] cone short stay admitting at 530 AM.  Call this number if you have problems the morning of surgery: [phone]   Remember:   Do not eat food or drink liquids after midnight.   Take these medicines the morning of surgery with A SIP OF WATER: none    STOP all herbel meds, nsaids (aleve,naproxen,advil,ibuprofen) 5 days prior to surgery (10/26/13)including vitamins,aspirin, meloxicam   Do not wear jewelry, make-up or nail polish.  Do not wear lotions, powders, or perfumes. You may wear deodorant.  Do not shave 48 hours prior to surgery. Men may shave face and neck.  Do not bring valuables to the hospital.  [facility] is not responsible                  for any belongings or valuables.               Contacts, dentures or bridgework may not be worn into surgery.  Leave suitcase in the car. After surgery it may be brought to your room.  For patients admitted to the hospital, discharge time is determined by your                treatment team.               Patients discharged the day of surgery will not be allowed to drive  home.  Name and phone number of your driver:   Special Instructions:  Special Instructions: [facility] - Preparing for Surgery  Before surgery, you can play an important role.  Because skin is not sterile, your skin needs to be as free of germs as possible.  You can reduce the number of germs on you skin by washing with CHG (chlorahexidine gluconate) soap before surgery.  CHG is an antiseptic cleaner which kills germs and bonds with the skin to continue killing germs even after washing.  Please DO NOT use if you have an allergy to CHG or antibacterial soaps.  If your skin becomes reddened/irritated stop using the CHG and inform your nurse when you arrive at Short Stay.  Do not shave (including legs and underarms) for at least 48 hours prior to the first CHG shower.  You may shave your  face.  Please follow these instructions carefully:   1.  Shower with CHG Soap the night before surgery and the morning of Surgery.  2.  If you choose to wash your hair, wash your hair first as usual with your normal shampoo.  3.  After you shampoo, rinse your hair and body thoroughly to remove the Shampoo.  4.  Use CHG as you would any other liquid soap.  You can apply chg directly  to the skin and wash gently with scrungie or a clean washcloth.  5.  Apply the CHG Soap to your body ONLY FROM THE NECK DOWN.  Do not use on open wounds or open sores.  Avoid contact with your eyes ears, mouth and genitals (private parts).  Wash genitals (private parts)       with your normal soap.  6.  Wash thoroughly, paying special attention to the area where your surgery will be performed.  7.  Thoroughly rinse your body with warm water from the neck down.  8.  DO NOT shower/wash with your normal soap after using and rinsing off the CHG Soap.  9.  Pat yourself dry  with a clean towel.            10.  Wear clean pajamas.            11.  Place clean sheets on your bed the night of your first shower and do not sleep with pets.  Day of Surgery  Do not apply any lotions/deodorants the morning of surgery.  Please wear clean clothes to the hospital/surgery center.   Please read over the following fact sheets that you were given: Pain Booklet, Coughing and Deep Breathing, Blood Transfusion Information, MRSA Information and Surgical Site Infection Prevention

## 2013-10-18 ENCOUNTER — Encounter (HOSPITAL_COMMUNITY): Payer: Self-pay

## 2013-10-18 ENCOUNTER — Encounter (HOSPITAL_COMMUNITY)
Admission: RE | Admit: 2013-10-18 | Discharge: 2013-10-18 | Disposition: A | Discharge status: Home / Self Care | Payer: Medicaid Other | Source: Ambulatory Visit | Attending: Orthopedic Surgery | Admitting: Orthopedic Surgery

## 2013-10-18 ENCOUNTER — Encounter (HOSPITAL_COMMUNITY): Payer: Self-pay | Admitting: Pharmacy Technician

## 2013-10-18 DIAGNOSIS — M19019 Primary osteoarthritis, unspecified shoulder: Secondary | ICD-10-CM | POA: Diagnosis present

## 2013-10-18 DIAGNOSIS — Z01812 Encounter for preprocedural laboratory examination: Secondary | ICD-10-CM | POA: Diagnosis present

## 2013-10-18 HISTORY — DX: Respiratory tuberculosis unspecified: A15.9

## 2013-10-18 HISTORY — DX: Unspecified osteoarthritis, unspecified site: M19.90

## 2013-10-18 LAB — COMPREHENSIVE METABOLIC PANEL
ALT: 68 U/L — AB (ref 0–53)
AST: 71 U/L — AB (ref 0–37)
Albumin: 3.5 g/dL (ref 3.5–5.2)
Alkaline Phosphatase: 58 U/L (ref 39–117)
Anion gap: 11 (ref 5–15)
BILIRUBIN TOTAL: 0.4 mg/dL (ref 0.3–1.2)
BUN: 10 mg/dL (ref 6–23)
CALCIUM: 8.8 mg/dL (ref 8.4–10.5)
CHLORIDE: 104 meq/L (ref 96–112)
CO2: 24 mEq/L (ref 19–32)
Creatinine, Ser: 1.03 mg/dL (ref 0.50–1.35)
GFR calc Af Amer: 90 mL/min — ABNORMAL LOW (ref >90)
GFR calc non Af Amer: 78 mL/min — ABNORMAL LOW (ref >90)
Glucose, Bld: 107 mg/dL — ABNORMAL HIGH (ref 70–99)
Potassium: 4.4 mEq/L (ref 3.7–5.3)
SODIUM: 139 meq/L (ref 137–147)
Total Protein: 6.8 g/dL (ref 6.0–8.3)

## 2013-10-18 LAB — CBC
HCT: 43.2 % (ref 39.0–52.0)
Hemoglobin: 14.4 g/dL (ref 13.0–17.0)
MCH: 28.1 pg (ref 26.0–34.0)
MCHC: 33.3 g/dL (ref 30.0–36.0)
MCV: 84.2 fL (ref 78.0–100.0)
PLATELETS: 210 10*3/uL (ref 150–400)
RBC: 5.13 MIL/uL (ref 4.22–5.81)
RDW: 14.6 % (ref 11.5–15.5)
WBC: 3.5 10*3/uL — ABNORMAL LOW (ref 4.0–10.5)

## 2013-10-18 LAB — TYPE AND SCREEN
ABO/RH(D): A POS
ANTIBODY SCREEN: NEGATIVE

## 2013-10-18 LAB — ABO/RH: ABO/RH(D): A POS

## 2013-10-30 MED ORDER — CEFAZOLIN SODIUM-DEXTROSE 2-3 GM-% IV SOLR
2.0000 g | INTRAVENOUS | Status: AC
  Administered 2013-10-31: 2 g via INTRAVENOUS
  Filled 2013-10-30: qty 50

## 2013-10-31 ENCOUNTER — Inpatient Hospital Stay (HOSPITAL_COMMUNITY): Payer: Medicaid Other

## 2013-10-31 ENCOUNTER — Encounter (HOSPITAL_COMMUNITY): Payer: Self-pay | Admitting: *Deleted

## 2013-10-31 ENCOUNTER — Encounter (HOSPITAL_COMMUNITY)
Admission: RE | Disposition: A | Discharge status: Home / Self Care | Payer: Self-pay | Source: Ambulatory Visit | Attending: Orthopedic Surgery

## 2013-10-31 ENCOUNTER — Inpatient Hospital Stay (HOSPITAL_COMMUNITY): Payer: Medicaid Other | Admitting: Certified Registered Nurse Anesthetist

## 2013-10-31 ENCOUNTER — Inpatient Hospital Stay (HOSPITAL_COMMUNITY)
Admission: RE | Admit: 2013-10-31 | Discharge: 2013-11-01 | DRG: 483 | Disposition: A | Discharge status: Home / Self Care | Payer: Medicaid Other | Source: Ambulatory Visit | Attending: Orthopedic Surgery | Admitting: Orthopedic Surgery

## 2013-10-31 ENCOUNTER — Encounter (HOSPITAL_COMMUNITY): Payer: Medicaid Other | Admitting: Certified Registered Nurse Anesthetist

## 2013-10-31 DIAGNOSIS — Z8614 Personal history of Methicillin resistant Staphylococcus aureus infection: Secondary | ICD-10-CM

## 2013-10-31 DIAGNOSIS — Z9109 Other allergy status, other than to drugs and biological substances: Secondary | ICD-10-CM | POA: Diagnosis not present

## 2013-10-31 DIAGNOSIS — B1920 Unspecified viral hepatitis C without hepatic coma: Secondary | ICD-10-CM | POA: Diagnosis present

## 2013-10-31 DIAGNOSIS — M19011 Primary osteoarthritis, right shoulder: Principal | ICD-10-CM | POA: Diagnosis present

## 2013-10-31 DIAGNOSIS — M25711 Osteophyte, right shoulder: Secondary | ICD-10-CM | POA: Diagnosis present

## 2013-10-31 DIAGNOSIS — Z880 Allergy status to penicillin: Secondary | ICD-10-CM | POA: Diagnosis not present

## 2013-10-31 DIAGNOSIS — F17210 Nicotine dependence, cigarettes, uncomplicated: Secondary | ICD-10-CM | POA: Diagnosis present

## 2013-10-31 HISTORY — PX: TOTAL SHOULDER ARTHROPLASTY: SHX126

## 2013-10-31 HISTORY — DX: Primary osteoarthritis, right shoulder: M19.011

## 2013-10-31 LAB — SURGICAL PCR SCREEN
MRSA, PCR: NEGATIVE
Staphylococcus aureus: NEGATIVE

## 2013-10-31 SURGERY — ARTHROPLASTY, SHOULDER, TOTAL
Anesthesia: Regional | Site: Shoulder | Laterality: Right

## 2013-10-31 MED ORDER — VANCOMYCIN HCL IN DEXTROSE 1-5 GM/200ML-% IV SOLN
INTRAVENOUS | Status: AC
  Administered 2013-10-31: 1000 mg via INTRAVENOUS
  Filled 2013-10-31: qty 200

## 2013-10-31 MED ORDER — DEXAMETHASONE SODIUM PHOSPHATE 4 MG/ML IJ SOLN
INTRAMUSCULAR | Status: DC | PRN
  Administered 2013-10-31: 4 mg via PERINEURAL

## 2013-10-31 MED ORDER — PNEUMOCOCCAL VAC POLYVALENT 25 MCG/0.5ML IJ INJ
0.5000 mL | INJECTION | INTRAMUSCULAR | Status: AC
  Administered 2013-11-01: 0.5 mL via INTRAMUSCULAR
  Filled 2013-10-31: qty 0.5

## 2013-10-31 MED ORDER — LIDOCAINE HCL (CARDIAC) 20 MG/ML IV SOLN
INTRAVENOUS | Status: DC | PRN
  Administered 2013-10-31: 70 mg via INTRAVENOUS

## 2013-10-31 MED ORDER — GLYCOPYRROLATE 0.2 MG/ML IJ SOLN
INTRAMUSCULAR | Status: AC
  Filled 2013-10-31: qty 1

## 2013-10-31 MED ORDER — NEOSTIGMINE METHYLSULFATE 10 MG/10ML IV SOLN
INTRAVENOUS | Status: AC
  Filled 2013-10-31: qty 1

## 2013-10-31 MED ORDER — ONDANSETRON HCL 4 MG PO TABS: 4.0000 mg | ORAL_TABLET | Freq: Three times a day (TID) | ORAL | Status: DC | PRN

## 2013-10-31 MED ORDER — OXYCODONE-ACETAMINOPHEN 10-325 MG PO TABS: 1.0000 | ORAL_TABLET | Freq: Four times a day (QID) | ORAL | Status: DC | PRN

## 2013-10-31 MED ORDER — OXYCODONE HCL 5 MG PO TABS
5.0000 mg | ORAL_TABLET | ORAL | Status: DC | PRN
  Administered 2013-10-31 – 2013-11-01 (×2): 10 mg via ORAL
  Filled 2013-10-31 (×2): qty 2

## 2013-10-31 MED ORDER — HYDROMORPHONE HCL 1 MG/ML IJ SOLN
0.5000 mg | INTRAMUSCULAR | Status: DC | PRN
  Administered 2013-10-31 – 2013-11-01 (×2): 1 mg via INTRAVENOUS
  Filled 2013-10-31 (×2): qty 1

## 2013-10-31 MED ORDER — SENNA-DOCUSATE SODIUM 8.6-50 MG PO TABS: 2.0000 | ORAL_TABLET | Freq: Every day | ORAL | Status: DC

## 2013-10-31 MED ORDER — SODIUM CHLORIDE 0.9 % IR SOLN
Status: DC | PRN
Start: 2013-10-31 — End: 2013-10-31
  Administered 2013-10-31: 1000 mL

## 2013-10-31 MED ORDER — DOCUSATE SODIUM 100 MG PO CAPS
100.0000 mg | ORAL_CAPSULE | Freq: Two times a day (BID) | ORAL | Status: DC
  Administered 2013-10-31 – 2013-11-01 (×2): 100 mg via ORAL
  Filled 2013-10-31 (×2): qty 1

## 2013-10-31 MED ORDER — METHOCARBAMOL 500 MG PO TABS
500.0000 mg | ORAL_TABLET | Freq: Four times a day (QID) | ORAL | Status: DC | PRN
  Administered 2013-11-01: 500 mg via ORAL
  Filled 2013-10-31 (×2): qty 1

## 2013-10-31 MED ORDER — ACETAMINOPHEN 325 MG PO TABS: 650.0000 mg | ORAL_TABLET | Freq: Four times a day (QID) | ORAL | Status: DC | PRN

## 2013-10-31 MED ORDER — MUPIROCIN 2 % EX OINT
1.0000 "application " | TOPICAL_OINTMENT | Freq: Once | CUTANEOUS | Status: AC
  Administered 2013-10-31: 1 via TOPICAL

## 2013-10-31 MED ORDER — DIPHENHYDRAMINE HCL 12.5 MG/5ML PO ELIX
12.5000 mg | ORAL_SOLUTION | ORAL | Status: DC | PRN
  Administered 2013-11-01: 25 mg via ORAL
  Filled 2013-10-31: qty 10

## 2013-10-31 MED ORDER — LACTATED RINGERS IV SOLN
INTRAVENOUS | Status: DC | PRN
  Administered 2013-10-31 (×2): via INTRAVENOUS

## 2013-10-31 MED ORDER — SUCCINYLCHOLINE CHLORIDE 20 MG/ML IJ SOLN
INTRAMUSCULAR | Status: AC
  Filled 2013-10-31: qty 1

## 2013-10-31 MED ORDER — FENTANYL CITRATE 0.05 MG/ML IJ SOLN
INTRAMUSCULAR | Status: AC
  Filled 2013-10-31: qty 5

## 2013-10-31 MED ORDER — POTASSIUM CHLORIDE IN NACL 20-0.45 MEQ/L-% IV SOLN
INTRAVENOUS | Status: DC
  Administered 2013-10-31: 17:00:00 via INTRAVENOUS
  Filled 2013-10-31 (×3): qty 1000

## 2013-10-31 MED ORDER — SENNA 8.6 MG PO TABS
1.0000 | ORAL_TABLET | Freq: Two times a day (BID) | ORAL | Status: DC
  Administered 2013-10-31 – 2013-11-01 (×2): 8.6 mg via ORAL
  Filled 2013-10-31 (×3): qty 1

## 2013-10-31 MED ORDER — GLYCOPYRROLATE 0.2 MG/ML IJ SOLN
INTRAMUSCULAR | Status: DC | PRN
  Administered 2013-10-31: 0.2 mg via INTRAVENOUS
  Administered 2013-10-31: 0.6 mg via INTRAVENOUS

## 2013-10-31 MED ORDER — MAGNESIUM CITRATE PO SOLN: 1.0000 | Freq: Once | ORAL | Status: AC | PRN

## 2013-10-31 MED ORDER — DEXAMETHASONE SODIUM PHOSPHATE 4 MG/ML IJ SOLN
INTRAMUSCULAR | Status: AC
  Filled 2013-10-31: qty 1

## 2013-10-31 MED ORDER — CEFAZOLIN SODIUM 1-5 GM-% IV SOLN
1.0000 g | Freq: Four times a day (QID) | INTRAVENOUS | Status: AC
  Administered 2013-10-31 – 2013-11-01 (×3): 1 g via INTRAVENOUS
  Filled 2013-10-31 (×3): qty 50

## 2013-10-31 MED ORDER — EPHEDRINE SULFATE 50 MG/ML IJ SOLN
INTRAMUSCULAR | Status: AC
  Filled 2013-10-31: qty 1

## 2013-10-31 MED ORDER — ONDANSETRON HCL 4 MG/2ML IJ SOLN
INTRAMUSCULAR | Status: AC
  Filled 2013-10-31: qty 2

## 2013-10-31 MED ORDER — ROCURONIUM BROMIDE 100 MG/10ML IV SOLN
INTRAVENOUS | Status: DC | PRN
  Administered 2013-10-31: 50 mg via INTRAVENOUS

## 2013-10-31 MED ORDER — OXYCODONE-ACETAMINOPHEN 5-325 MG PO TABS
1.0000 | ORAL_TABLET | ORAL | Status: DC | PRN
  Administered 2013-11-01 (×2): 2 via ORAL
  Filled 2013-10-31 (×3): qty 2

## 2013-10-31 MED ORDER — INFLUENZA VAC SPLIT QUAD 0.5 ML IM SUSY
0.5000 mL | PREFILLED_SYRINGE | INTRAMUSCULAR | Status: AC
  Administered 2013-11-01: 0.5 mL via INTRAMUSCULAR
  Filled 2013-10-31: qty 0.5

## 2013-10-31 MED ORDER — GLYCOPYRROLATE 0.2 MG/ML IJ SOLN
INTRAMUSCULAR | Status: AC
  Filled 2013-10-31: qty 3

## 2013-10-31 MED ORDER — OXYCODONE HCL 5 MG PO TABS: 5.0000 mg | ORAL_TABLET | Freq: Once | ORAL | Status: DC | PRN

## 2013-10-31 MED ORDER — METOCLOPRAMIDE HCL 5 MG/ML IJ SOLN: 5.0000 mg | Freq: Three times a day (TID) | INTRAMUSCULAR | Status: DC | PRN

## 2013-10-31 MED ORDER — ARTIFICIAL TEARS OP OINT
TOPICAL_OINTMENT | OPHTHALMIC | Status: DC | PRN
  Administered 2013-10-31: 1 via OPHTHALMIC

## 2013-10-31 MED ORDER — PROPOFOL 10 MG/ML IV BOLUS
INTRAVENOUS | Status: AC
  Filled 2013-10-31: qty 20

## 2013-10-31 MED ORDER — LIDOCAINE HCL (CARDIAC) 20 MG/ML IV SOLN
INTRAVENOUS | Status: AC
  Filled 2013-10-31: qty 5

## 2013-10-31 MED ORDER — NEOSTIGMINE METHYLSULFATE 10 MG/10ML IV SOLN
INTRAVENOUS | Status: DC | PRN
  Administered 2013-10-31: 4 mg via INTRAVENOUS

## 2013-10-31 MED ORDER — POLYETHYLENE GLYCOL 3350 17 G PO PACK: 17.0000 g | PACK | Freq: Every day | ORAL | Status: DC | PRN

## 2013-10-31 MED ORDER — MUPIROCIN 2 % EX OINT
TOPICAL_OINTMENT | CUTANEOUS | Status: AC
  Administered 2013-10-31: 1 via TOPICAL
  Filled 2013-10-31: qty 22

## 2013-10-31 MED ORDER — FENTANYL CITRATE 0.05 MG/ML IJ SOLN
INTRAMUSCULAR | Status: DC | PRN
  Administered 2013-10-31 (×2): 50 ug via INTRAVENOUS

## 2013-10-31 MED ORDER — OXYCODONE HCL 5 MG/5ML PO SOLN: 5.0000 mg | Freq: Once | ORAL | Status: DC | PRN

## 2013-10-31 MED ORDER — HYDROMORPHONE HCL 1 MG/ML IJ SOLN: 0.2500 mg | INTRAMUSCULAR | Status: DC | PRN

## 2013-10-31 MED ORDER — METOCLOPRAMIDE HCL 10 MG PO TABS: 5.0000 mg | ORAL_TABLET | Freq: Three times a day (TID) | ORAL | Status: DC | PRN

## 2013-10-31 MED ORDER — BUPIVACAINE HCL (PF) 0.25 % IJ SOLN
INTRAMUSCULAR | Status: AC
  Filled 2013-10-31: qty 30

## 2013-10-31 MED ORDER — ONDANSETRON HCL 4 MG PO TABS: 4.0000 mg | ORAL_TABLET | Freq: Four times a day (QID) | ORAL | Status: DC | PRN

## 2013-10-31 MED ORDER — PROPOFOL 10 MG/ML IV BOLUS
INTRAVENOUS | Status: DC | PRN
  Administered 2013-10-31: 40 mg via INTRAVENOUS
  Administered 2013-10-31: 160 mg via INTRAVENOUS

## 2013-10-31 MED ORDER — ONDANSETRON HCL 4 MG/2ML IJ SOLN
INTRAMUSCULAR | Status: DC | PRN
  Administered 2013-10-31: 4 mg via INTRAVENOUS

## 2013-10-31 MED ORDER — ROPIVACAINE HCL 5 MG/ML IJ SOLN
INTRAMUSCULAR | Status: DC | PRN
Start: 2013-10-31 — End: 2013-10-31
  Administered 2013-10-31: 20 mL via PERINEURAL

## 2013-10-31 MED ORDER — ACETAMINOPHEN 650 MG RE SUPP: 650.0000 mg | Freq: Four times a day (QID) | RECTAL | Status: DC | PRN

## 2013-10-31 MED ORDER — MENTHOL 3 MG MT LOZG: 1.0000 | LOZENGE | OROMUCOSAL | Status: DC | PRN

## 2013-10-31 MED ORDER — ALUM & MAG HYDROXIDE-SIMETH 200-200-20 MG/5ML PO SUSP: 30.0000 mL | ORAL | Status: DC | PRN

## 2013-10-31 MED ORDER — ONDANSETRON HCL 4 MG/2ML IJ SOLN: 4.0000 mg | Freq: Four times a day (QID) | INTRAMUSCULAR | Status: DC | PRN

## 2013-10-31 MED ORDER — STERILE WATER FOR INJECTION IJ SOLN
INTRAMUSCULAR | Status: AC
  Filled 2013-10-31: qty 10

## 2013-10-31 MED ORDER — METHOCARBAMOL 1000 MG/10ML IJ SOLN
500.0000 mg | Freq: Four times a day (QID) | INTRAMUSCULAR | Status: DC | PRN
  Filled 2013-10-31: qty 5

## 2013-10-31 MED ORDER — PHENOL 1.4 % MT LIQD: 1.0000 | OROMUCOSAL | Status: DC | PRN

## 2013-10-31 MED ORDER — MIDAZOLAM HCL 2 MG/2ML IJ SOLN
INTRAMUSCULAR | Status: AC
  Filled 2013-10-31: qty 2

## 2013-10-31 MED ORDER — ROCURONIUM BROMIDE 50 MG/5ML IV SOLN
INTRAVENOUS | Status: AC
  Filled 2013-10-31: qty 1

## 2013-10-31 MED ORDER — BISACODYL 10 MG RE SUPP: 10.0000 mg | Freq: Every day | RECTAL | Status: DC | PRN

## 2013-10-31 MED ORDER — BACLOFEN 10 MG PO TABS: 10.0000 mg | ORAL_TABLET | Freq: Three times a day (TID) | ORAL | Status: DC

## 2013-10-31 MED ORDER — PHENYLEPHRINE HCL 10 MG/ML IJ SOLN
10.0000 mg | INTRAVENOUS | Status: DC | PRN
  Administered 2013-10-31: 20 ug/min via INTRAVENOUS

## 2013-10-31 MED ORDER — MIDAZOLAM HCL 5 MG/5ML IJ SOLN
INTRAMUSCULAR | Status: DC | PRN
  Administered 2013-10-31: 2 mg via INTRAVENOUS

## 2013-10-31 SURGICAL SUPPLY — 60 items
BENZOIN TINCTURE PRP APPL 2/3 (GAUZE/BANDAGES/DRESSINGS) ×2 IMPLANT
BIT DRILL QUICK REL 1/8 2PK SL (DRILL) ×1 IMPLANT
BLADE SAW SGTL MED 73X18.5 STR (BLADE) ×2 IMPLANT
BOWL SMART MIX CTS (DISPOSABLE) IMPLANT
BRUSH FEMORAL CANAL (MISCELLANEOUS) IMPLANT
CAP HEMI-SHOULDER LVL 2 (Capitated) ×2 IMPLANT
CLSR STERI-STRIP ANTIMIC 1/2X4 (GAUZE/BANDAGES/DRESSINGS) ×2 IMPLANT
COVER SURGICAL LIGHT HANDLE (MISCELLANEOUS) ×2 IMPLANT
COVER TABLE BACK 60X90 (DRAPES) IMPLANT
DRAPE C-ARM 42X72 X-RAY (DRAPES) IMPLANT
DRAPE INCISE IOBAN 66X45 STRL (DRAPES) IMPLANT
DRAPE PROXIMA HALF (DRAPES) ×2 IMPLANT
DRAPE U-SHAPE 47X51 STRL (DRAPES) ×2 IMPLANT
DRILL QUICK RELEASE 1/8 INCH (DRILL) ×1
DRSG MEPILEX BORDER 4X8 (GAUZE/BANDAGES/DRESSINGS) ×2 IMPLANT
DURAPREP 26ML APPLICATOR (WOUND CARE) ×2 IMPLANT
ELECT REM PT RETURN 9FT ADLT (ELECTROSURGICAL) ×2
ELECTRODE REM PT RTRN 9FT ADLT (ELECTROSURGICAL) ×1 IMPLANT
EVACUATOR 1/8 PVC DRAIN (DRAIN) IMPLANT
FACESHIELD WRAPAROUND (MASK) IMPLANT
GAUZE SPONGE 4X4 12PLY STRL (GAUZE/BANDAGES/DRESSINGS) IMPLANT
GLOVE BIOGEL PI ORTHO PRO SZ8 (GLOVE) ×2
GLOVE ORTHO TXT STRL SZ7.5 (GLOVE) ×2 IMPLANT
GLOVE PI ORTHO PRO STRL SZ8 (GLOVE) ×2 IMPLANT
GLOVE SURG ORTHO 8.0 STRL STRW (GLOVE) ×4 IMPLANT
GOWN BRE IMP PREV XXLGXLNG (GOWN DISPOSABLE) ×2 IMPLANT
GOWN STRL REUS W/ TWL XL LVL3 (GOWN DISPOSABLE) IMPLANT
GOWN STRL REUS W/TWL XL LVL3 (GOWN DISPOSABLE)
HANDPIECE INTERPULSE COAX TIP (DISPOSABLE)
HOOD PEEL AWAY FACE SHEILD DIS (HOOD) ×6 IMPLANT
KIT BASIN OR (CUSTOM PROCEDURE TRAY) ×2 IMPLANT
KIT ROOM TURNOVER OR (KITS) ×2 IMPLANT
MANIFOLD NEPTUNE II (INSTRUMENTS) ×2 IMPLANT
NEEDLE 1/2 CIR CATGUT .05X1.09 (NEEDLE) IMPLANT
NEEDLE HYPO 25GX1X1/2 BEV (NEEDLE) IMPLANT
NS IRRIG 1000ML POUR BTL (IV SOLUTION) ×2 IMPLANT
PACK SHOULDER (CUSTOM PROCEDURE TRAY) ×2 IMPLANT
PAD ARMBOARD 7.5X6 YLW CONV (MISCELLANEOUS) ×4 IMPLANT
PIN HUMERAL STMN 3.2MMX9IN (INSTRUMENTS) ×2 IMPLANT
SET HNDPC FAN SPRY TIP SCT (DISPOSABLE) IMPLANT
SLING ARM IMMOBILIZER LRG (SOFTGOODS) ×2 IMPLANT
SLING ARM IMMOBILIZER MED (SOFTGOODS) IMPLANT
SMARTMIX MINI TOWER (MISCELLANEOUS)
SPONGE LAP 18X18 X RAY DECT (DISPOSABLE) ×2 IMPLANT
SUCTION FRAZIER TIP 10 FR DISP (SUCTIONS) ×2 IMPLANT
SUPPORT WRAP ARM LG (MISCELLANEOUS) ×2 IMPLANT
SUT FIBERWIRE #2 38 REV NDL BL (SUTURE) ×4
SUT MAXBRAID (SUTURE) ×6 IMPLANT
SUT MNCRL AB 4-0 PS2 18 (SUTURE) IMPLANT
SUT VIC AB 0 CT1 27 (SUTURE) ×1
SUT VIC AB 0 CT1 27XBRD ANBCTR (SUTURE) ×1 IMPLANT
SUT VIC AB 2-0 CT1 27 (SUTURE)
SUT VIC AB 2-0 CT1 TAPERPNT 27 (SUTURE) IMPLANT
SUT VIC AB 3-0 SH 8-18 (SUTURE) ×2 IMPLANT
SUTURE FIBERWR#2 38 REV NDL BL (SUTURE) ×2 IMPLANT
SYR CONTROL 10ML LL (SYRINGE) IMPLANT
TOWEL OR 17X24 6PK STRL BLUE (TOWEL DISPOSABLE) ×2 IMPLANT
TOWEL OR 17X26 10 PK STRL BLUE (TOWEL DISPOSABLE) ×2 IMPLANT
TOWER SMARTMIX MINI (MISCELLANEOUS) IMPLANT
WATER STERILE IRR 1000ML POUR (IV SOLUTION) IMPLANT

## 2013-10-31 NOTE — Op Note (Signed)
10/31/2013  10:17 AM  PATIENT:  Keith Deleon    PRE-OPERATIVE DIAGNOSIS:  RIGHT SHOULDER OSTEOARTHRITIS  POST-OPERATIVE DIAGNOSIS:  Same  PROCEDURE:  RIGHT SHOULDER HEMIARTHROPLASTY  SURGEON:  [name],Ariela Mochizuki P, MD  PHYSICIAN ASSISTANT: Janace LittenBrandon Parry, OPA-C, present and scrubbed throughout the case, critical for completion in a timely fashion, and for retraction, instrumentation, and closure.  Second assistant: Alfredo MartinezJustin Ollis, PA Student  ANESTHESIA:   General  PREOPERATIVE INDICATIONS:  Keith Deleon is a  58 y.o. male with a diagnosis of RIGHT SHOULDER DJD who failed conservative measures and elected for surgical management.    The risks benefits and alternatives were discussed with the patient preoperatively including but not limited to the risks of infection, bleeding, nerve injury, cardiopulmonary complications, the need for revision surgery, dislocation, loosening, incomplete relief of pain, among others, and the patient was willing to proceed.   OPERATIVE IMPLANTS: Biomet size 14 mini press-fit humeral stem, size 46x24 Versa-dial humeral head, set in the E position with increased coverage posterior superior.   OPERATIVE FINDINGS: Advanced glenohumeral osteoarthritis involving the glenoid and the humeral head with substantial osteophyte formation inferiorly and severe posterior wear of the glenoid, extremely tight access.   OPERATIVE PROCEDURE: The patient was brought to the operating room and placed in the supine position. General anesthesia was administered. IV antibiotics were given.  We gave both Ancef with a test dose which was fine, without reaction, as well as vancomycin given a reported history of MRSA infection on the chart.   The upper extremity was prepped and draped in usual sterile fashion. The patient was in a beachchair position with all bony prominences padded.   Time out was performed and a deltopectoral approach was carried out. The biceps tendon was tenodesed to  the pectoralis tendon. The subscapularis was released, tagging it with a #2 MaxBraid, leaving a cuff of tendon for repair.   The inferior osteophyte was removed, and release of the capsule off of the humeral side was completed. This required substantial inferior mobilization of the capsule, and releasing, and I had to use an osteotome in order to remove the large inferior osteophyte. The head was dislocated, but it was still not mobile enough to ream, and I performed a femoral neck resection using the extra medullary cutting guide essentially in situ. This was made at approximately 30 of retroversion.  This provided better access to the posterior osteophytes and superior osteophytes which I removed as much as possible.  I still do not have access adequately to the glenoid, and I removed the biceps tendon, along with the labrum and performed a 360 release of the labrum/capsule around the glenoid. This still did not provide adequate access to the glenoid.  I was intending to do a total shoulder replacement, I went back and resected more humeral neck, and in fact I did a total of 4 neck resections in order to sequentially try and achieve access to the posterior glenoid, but was not able to achieve this. After the fourth resection, I had resected more inferior neck in order to hopefully allow the [place] to provide the access necessary, recognizing that I would have a mismatch on the head for the stem, however I did this with hopes of gaining access to the glenoid. I almost had enough access, I placed a guidepin, and the medium-sized reamer would not fit into the space, and he was a medium-sized glenoid, and additionally the version required substantial anteversion in order to correct the posterior bone  wear, and I was not going to be of achieve a satisfactory position for the glenoid, so I abandoned the glenoid fixation, remove the pin, and turned my attention back to the humerus.  I reamed the humerus up to  a 14, and then I sequentially broached, up to the selected size, with the broach set at 30 of retroversion. I then placed the real stem. I trialed with multiple heads, and the above-named component was selected. Increased posterior coverage improved the coverage. The soft tissue tension was appropriate.   I then impacted the real humeral head into place, reduced the head, and irrigated copiously. Excellent stability and range of motion was achieved. I repaired the subscapularis with 4 #2 MaxBraid, as well as the rotator interval, and irrigated copiously once more. The subcutaneous tissue was closed with Vicryl including the deltopectoral fascia.   The skin was closed with Steri-Strips and sterile gauze was applied. He had a preoperative nerve block. He tolerated the procedure well and there were no complications.

## 2013-10-31 NOTE — Progress Notes (Signed)
Orthopedic Tech Progress Note Patient Details:  Keith RivesCarl L Charles 1955/10/19 [id number]  Patient ID: Keith Rivesarl L Doukas, male   DOB: 1955/10/19, 58 y.o.   MRN: [id number] Pt states that he does not need to use trapeze bar patient helper with only  One mobile arm ; rn notified  Nikki DomCrawford, Edman Lipsey 10/31/2013, 3:59 PM

## 2013-10-31 NOTE — Progress Notes (Signed)
Orthopedic Tech Progress Note Patient Details:  Keith Deleon 14-Apr-1955 [id number]  Ortho Devices Type of Ortho Device: Arm sling Ortho Device/Splint Location: rue Ortho Device/Splint Interventions: Application   Keith Deleon 10/31/2013, 4:03 PM

## 2013-10-31 NOTE — Progress Notes (Signed)
Report given to robin rn as caregiver 

## 2013-10-31 NOTE — Transfer of Care (Signed)
Immediate Anesthesia Transfer of Care Note  Patient: Keith RivesCarl L Bonnin  Procedure(s) Performed: Procedure(s): RIGHT TOTAL SHOULDER ARTHROPLASTY (Right)  Patient Location: PACU  Anesthesia Type:General and Regional  Level of Consciousness: awake, alert  and oriented  Airway & Oxygen Therapy: Patient Spontanous Breathing and Patient connected to nasal cannula oxygen  Post-op Assessment: Report given to PACU RN, Post -op Vital signs reviewed and stable and Patient moving all extremities X 4  Post vital signs: Reviewed and stable  Complications: No apparent anesthesia complications

## 2013-10-31 NOTE — Progress Notes (Signed)
Patient reports only taking one bath with CHG. Give patient CHG cloths. When I came back in to do preop assessment patient reports itching give patient cloth with plain water to rinse with. Patient with some relief. No hives or shortness of breath noted.

## 2013-10-31 NOTE — H&P (Signed)
PREOPERATIVE H&Deleon  Chief Complaint: RIGHT SHOULDER DJD  HPI: Keith Deleon is a 58 y.o. male who presents for preoperative history and physical with a diagnosis of RIGHT SHOULDER DJD. Symptoms are rated as moderate to severe, and have been worsening.  This is significantly impairing activities of daily living.  He has elected for surgical management. He has failed injections, activity modification, anti-inflammatories, and assistive devices.   Past Medical History  Diagnosis Date  . Hepatitis C   . Tuberculosis 88    tb rx  . Arthritis    Past Surgical History  Procedure Laterality Date  . Right knee surgery      cartilage   History   Social History  . Marital Status: Single    Spouse Name: N/A    Number of Children: N/A  . Years of Education: N/A   Social History Main Topics  . Smoking status: Current Every Day Smoker -- 0.25 packs/day for 25 years    Types: Cigarettes  . Smokeless tobacco: None  . Alcohol Use: Yes     Comment: 2-  40oz per day beer  . Drug Use: Yes    Special: Marijuana     Comment: last  time 10/17/13  . Sexual Activity: None   Other Topics Concern  . None   Social History Narrative  . None   History reviewed. No pertinent family history. Allergies  Allergen Reactions  . Chlorhexidine Gluconate Itching    Wipes, done ok with soap in bottle  . Penicillins Swelling   Prior to Admission medications   Medication Sig Start Date End Date Taking? Authorizing Provider  meloxicam (MOBIC) 7.5 MG tablet Take 1 tablet (7.5 mg total) by mouth daily. 04/11/13  Yes Marissa Sciacca, PA-C     Positive ROS: All other systems have been reviewed and were otherwise negative with the exception of those mentioned in the HPI and as above.  Physical Exam: General: Alert, no acute distress Cardiovascular: No pedal edema Respiratory: No cyanosis, no use of accessory musculature GI: No organomegaly, abdomen is soft and non-tender Skin: No lesions in the area of  chief complaint Neurologic: Sensation intact distally Psychiatric: Patient is competent for consent with normal mood and affect Lymphatic: No axillary or cervical lymphadenopathy  MUSCULOSKELETAL: Right shoulder active motion is 0-80, external rotation is 10 shy of neutral, positive crepitance.  X-rays demonstrate evidence for end-stage degenerative changes with osteophyte formation, sclerosis, loss of joint space.  Assessment: RIGHT SHOULDER DJD  Plan: Plan for Procedure(s): RIGHT TOTAL SHOULDER ARTHROPLASTY  The risks benefits and alternatives were discussed with the patient including but not limited to the risks of nonoperative treatment, versus surgical intervention including infection, bleeding, nerve injury,  blood clots, cardiopulmonary complications, morbidity, mortality, among others, and they were willing to proceed.   He has a report of history of MRSA, is not clear exactly what the source was, we have repeated the PCR screen today, we will give him vancomycin, and also give him a dose of Ancef with a test dose given his history of shortness of breath with penicillin.   [name],[name], Keith Deleon Cell [phone]   10/31/2013 7:19 AM

## 2013-10-31 NOTE — Discharge Instructions (Signed)
Diet: As you were doing prior to hospitalization   Shower:  May shower but keep the wounds dry, use an occlusive plastic wrap, NO SOAKING IN TUB.  If the bandage gets wet, change with a clean dry gauze.  Dressing:  Keep dressing in place unless saturated.  There are sticky tapes (steri-strips) on your wounds and all the stitches are absorbable.  Leave the steri-strips in place when changing your dressings, they will peel off with time, usually 2-3 weeks.  Activity:  Increase activity slowly as tolerated, but follow the weight bearing instructions below.  No lifting or driving for 6 weeks.  Weight Bearing:   Sling at all times.    To prevent constipation: you may use a stool softener such as -  Colace (over the counter) 100 mg by mouth twice a day  Drink plenty of fluids (prune juice may be helpful) and high fiber foods Miralax (over the counter) for constipation as needed.    Itching:  If you experience itching with your medications, try taking only a single pain pill, or even half a pain pill at a time.  You may take up to 10 pain pills per day, and you can also use benadryl over the counter for itching or also to help with sleep.   Precautions:  If you experience chest pain or shortness of breath - call 911 immediately for transfer to the hospital emergency department!!  If you develop a fever greater that 101 F, purulent drainage from wound, increased redness or drainage from wound, or calf pain -- Call the office at [phone]                                                Follow- Up Appointment:  Please call for an appointment to be seen in 2 weeks [place] - [phone]

## 2013-10-31 NOTE — Anesthesia Procedure Notes (Addendum)
Procedure Name: Intubation Date/Time: 10/31/2013 7:50 AM Performed by: Elberta LeatherwoodURNER, SARAH E Pre-anesthesia Checklist: Patient identified, Emergency Drugs available, Suction available and Patient being monitored Patient Re-evaluated:Patient Re-evaluated prior to inductionOxygen Delivery Method: Circle system utilized Preoxygenation: Pre-oxygenation with 100% oxygen Intubation Type: IV induction Ventilation: Mask ventilation without difficulty Laryngoscope Size: Mac and 3 Grade View: Grade II Tube type: Oral Tube size: 7.5 mm Number of attempts: 1 Airway Equipment and Method: Stylet Placement Confirmation: ETT inserted through vocal cords under direct vision,  positive ETCO2 and breath sounds checked- equal and bilateral Secured at: 23 cm Tube secured with: Tape Dental Injury: Teeth and Oropharynx as per pre-operative assessment  Comments: Intubation performed by Lenice PressmanEvan, SRNA under full supervision by CRNA and MD.   Anesthesia Regional Block:  Interscalene brachial plexus block  Pre-Anesthetic Checklist: ,, timeout performed, Correct Patient, Correct Site, Correct Laterality, Correct Procedure, Correct Position, site marked, Risks and benefits discussed,  Surgical consent,  Pre-op evaluation,  At surgeon's request and post-op pain management  Laterality: Upper and Right  Prep: chloraprep       Needles:  Injection technique: Single-shot  Needle Type: Echogenic Stimulator Needle          Additional Needles:  Procedures: ultrasound guided (picture in chart) and nerve stimulator Interscalene brachial plexus block  Nerve Stimulator or Paresthesia:  Response: deltoid, 0.5 mA,   Additional Responses:   Narrative:  Start time: 10/31/2013 7:21 AM End time: 10/31/2013 7:29 AM Injection made incrementally with aspirations every 5 mL.  Performed by: Personally  Anesthesiologist: Mallori Araque  Additional Notes: H+P and labs reviewed, risks and benefits discussed with patient, procedure  tolerated well without complications

## 2013-10-31 NOTE — Anesthesia Preprocedure Evaluation (Addendum)
Anesthesia Evaluation  Patient identified by MRN, date of birth, ID band Patient awake    Reviewed: Allergy & Precautions, H&P , NPO status , Patient's Chart, lab work & pertinent test results  History of Anesthesia Complications Negative for: history of anesthetic complications  Airway Mallampati: II TM Distance: >3 FB Neck ROM: Full    Dental  (+) Dental Advisory Given, Poor Dentition, Teeth Intact,    Pulmonary neg shortness of breath, neg sleep apnea, neg COPDneg recent URI, Current Smoker,  breath sounds clear to auscultation        Cardiovascular negative cardio ROS  Rhythm:Regular     Neuro/Psych negative neurological ROS  negative psych ROS   GI/Hepatic negative GI ROS, (+) Hepatitis -, C  Endo/Other  negative endocrine ROS  Renal/GU negative Renal ROS     Musculoskeletal  (+) Arthritis -,   Abdominal   Peds  Hematology negative hematology ROS (+)   Anesthesia Other Findings   Reproductive/Obstetrics                       Anesthesia Physical Anesthesia Plan  ASA: II  Anesthesia Plan: General and Regional   Post-op Pain Management:    Induction: Intravenous  Airway Management Planned: Oral ETT  Additional Equipment:   Intra-op Plan:   Post-operative Plan: Extubation in OR  Informed Consent: I have reviewed the patients History and Physical, chart, labs and discussed the procedure including the risks, benefits and alternatives for the proposed anesthesia with the patient or authorized representative who has indicated his/her understanding and acceptance.   Dental advisory given  Plan Discussed with: CRNA, Anesthesiologist and Surgeon  Anesthesia Plan Comments:        Anesthesia Quick Evaluation

## 2013-11-01 LAB — BASIC METABOLIC PANEL
ANION GAP: 14 (ref 5–15)
BUN: 9 mg/dL (ref 6–23)
CHLORIDE: 98 meq/L (ref 96–112)
CO2: 24 mEq/L (ref 19–32)
Calcium: 8.3 mg/dL — ABNORMAL LOW (ref 8.4–10.5)
Creatinine, Ser: 0.9 mg/dL (ref 0.50–1.35)
GFR calc Af Amer: >90 mL/min (ref >90)
GFR calc non Af Amer: >90 mL/min (ref >90)
Glucose, Bld: 171 mg/dL — ABNORMAL HIGH (ref 70–99)
POTASSIUM: 4 meq/L (ref 3.7–5.3)
SODIUM: 136 meq/L — AB (ref 137–147)

## 2013-11-01 LAB — CBC
HEMATOCRIT: 37.5 % — AB (ref 39.0–52.0)
Hemoglobin: 12.3 g/dL — ABNORMAL LOW (ref 13.0–17.0)
MCH: 27.6 pg (ref 26.0–34.0)
MCHC: 32.8 g/dL (ref 30.0–36.0)
MCV: 84.3 fL (ref 78.0–100.0)
PLATELETS: 189 10*3/uL (ref 150–400)
RBC: 4.45 MIL/uL (ref 4.22–5.81)
RDW: 14.1 % (ref 11.5–15.5)
WBC: 8.3 10*3/uL (ref 4.0–10.5)

## 2013-11-01 NOTE — Discharge Summary (Signed)
Physician Discharge Summary  Patient ID: OLEG OLESON MRN: [id number] DOB/AGE: 05/15/1955 58 y.o.  Admit date: 10/31/2013 Discharge date: 11/01/2013  Admission Diagnoses:  Osteoarthritis of right shoulder  Discharge Diagnoses:  Principal Problem:   Osteoarthritis of right shoulder Active Problems:   Primary osteoarthritis of right shoulder   Past Medical History  Diagnosis Date  . Hepatitis C   . Tuberculosis 88    tb rx  . Arthritis   . Osteoarthritis of right shoulder 10/31/2013    Surgeries: Procedure(s): RIGHT TOTAL SHOULDER ARTHROPLASTY on 10/31/2013   Consultants (if any):    Discharged Condition: Improved  Hospital Course: VISHNU MOELLER is an 58 y.o. male who was admitted 10/31/2013 with a diagnosis of Osteoarthritis of right shoulder and went to the operating room on 10/31/2013 and underwent the above named procedures.    He was given perioperative antibiotics:  Anti-infectives   Start     Dose/Rate Route Frequency Ordered Stop   10/31/13 1600  ceFAZolin (ANCEF) IVPB 1 g/50 mL premix     1 g 100 mL/hr over 30 Minutes Intravenous Every 6 hours 10/31/13 1554 11/01/13 0431   10/31/13 0719  vancomycin (VANCOCIN) 1 GM/200ML IVPB    Comments:  Turner, Sarah   : cabinet override      10/31/13 0719 10/31/13 0823   10/31/13 0600  ceFAZolin (ANCEF) IVPB 2 g/50 mL premix     2 g 100 mL/hr over 30 Minutes Intravenous On call to O.R. 10/30/13 1611 10/31/13 0817    .  He was given sequential compression devices, early ambulation  for DVT prophylaxis.  He benefited maximally from the hospital stay and there were no complications.    Recent vital signs:  Filed Vitals:   11/01/13 0145  BP: 111/56  Pulse: 58  Temp: 99.4 F (37.4 C)  Resp: 18    Recent laboratory studies:  Lab Results  Component Value Date   HGB 14.4 10/18/2013   HGB 14.5 04/11/2013   HGB 14.2 10/28/2011   Lab Results  Component Value Date   WBC 3.5* 10/18/2013   PLT 210 10/18/2013   No  results found for this basename: INR   Lab Results  Component Value Date   NA 139 10/18/2013   K 4.4 10/18/2013   CL 104 10/18/2013   CO2 24 10/18/2013   BUN 10 10/18/2013   CREATININE 1.03 10/18/2013   GLUCOSE 107* 10/18/2013    Discharge Medications:     Medication List    STOP taking these medications       meloxicam 7.5 MG tablet  Commonly known as:  MOBIC      TAKE these medications       baclofen 10 MG tablet  Commonly known as:  LIORESAL  Take 1 tablet (10 mg total) by mouth 3 (three) times daily. As needed for muscle spasm     ondansetron 4 MG tablet  Commonly known as:  ZOFRAN  Take 1 tablet (4 mg total) by mouth every 8 (eight) hours as needed for nausea or vomiting.     oxyCODONE-acetaminophen 10-325 MG per tablet  Commonly known as:  PERCOCET  Take 1-2 tablets by mouth every 6 (six) hours as needed for pain. MAXIMUM TOTAL ACETAMINOPHEN DOSE IS 4000 MG PER DAY     sennosides-docusate sodium 8.6-50 MG tablet  Commonly known as:  SENOKOT-S  Take 2 tablets by mouth daily.        Diagnostic Studies: Dg Shoulder Right  10/31/2013  CLINICAL DATA:  Postoperative evaluation status post right shoulder hemiarthroplasty.  EXAM: RIGHT SHOULDER - 2+ VIEW  COMPARISON:  04/11/2013  FINDINGS: Postoperative changes status post right shoulder arthroplasty. Hardware appears intact. Postoperative changes within the overlying soft tissue.  IMPRESSION: Status post right shoulder arthroplasty. No evidence for acute or hardware abnormality on single AP view.   Electronically Signed   By: Annia Beltrew  Davis M.D.   On: 10/31/2013 11:46    Disposition: 01-Home or Self Care        Follow-up Information   Follow up with [name],Senie Lanese P, MD. Schedule an appointment as soon as possible for a visit in 2 weeks.   Specialty:  Orthopedic Surgery   Contact information:   [address] [place] [address] [phone]        Signed: [name],Kenlee Vogt P 11/01/2013, 8:29  AM

## 2013-11-01 NOTE — Progress Notes (Signed)
Utilization review completed.  

## 2013-11-01 NOTE — Evaluation (Signed)
Occupational Therapy Evaluation Patient Details Name: Keith Deleon MRN: [id number] DOB: 24-Aug-1955 Today's Date: 11/01/2013    History of Present Illness s/p R total shoulder arthoplasty   Clinical Impression   Pt admitted with the above diagnoses and presents with below problem list. Pt will benefit from continued acute OT to address the below listed deficits and maximize independence with basic ADLs prior to d/c home. Prior to admission pt was mod I with occasional use of homemade cane with pt reporting, "I don't really need though." Pt currently at min guard to min A level for ADLs. Pt reports he lives with sister who can provide 24 hour supervision/assistance. Pt completed toilet transfer, ambulation and stairs at min guard level with no observed loss of balance. ADL and sling education provided. Exercises performed as detailed below.     Follow Up Recommendations  Supervision/Assistance - 24 hour;No OT follow up    Equipment Recommendations  None recommended by OT;Other (comment) (Pt has 3n1 )    Recommendations for Other Services       Precautions / Restrictions Precautions Precautions: Shoulder Type of Shoulder Precautions: NWB, no motion at shoulder Shoulder Interventions: Shoulder sling/immobilizer;Off for dressing/bathing/exercises Precaution Booklet Issued: Yes (comment) Precaution Comments: reviewed and provided shoulder protocol handout Required Braces or Orthoses: Sling Restrictions Weight Bearing Restrictions: Yes RUE Weight Bearing: Non weight bearing      Mobility Bed Mobility Overal bed mobility: Modified Independent                Transfers Overall transfer level: Needs assistance   Transfers: Sit to/from Stand Sit to Stand: Supervision              Balance Overall balance assessment: Needs assistance         Standing balance support: No upper extremity supported;During functional activity Standing balance-Leahy Scale: Good Standing  balance comment: no loss of balance in standing or during ambulation                            ADL Overall ADL's : Needs assistance/impaired Eating/Feeding: Set up;Sitting   Grooming: Set up;Standing   Upper Body Bathing: Min guard;Sitting   Lower Body Bathing: Min guard;Sit to/from stand   Upper Body Dressing : Minimal assistance;Sitting   Lower Body Dressing: Min guard;Sit to/from stand   Toilet Transfer: Min guard;Ambulation;Comfort height toilet;Grab bars   Toileting- Clothing Manipulation and Hygiene: Min guard;Sit to/from stand   Tub/ Shower Transfer: Min guard;Ambulation;3 in 1   Functional mobility during ADLs: Min guard General ADL Comments: ADL and sling education provided. Pt declined UB/LB dressing practice. Ambulated to bathroom and rehab gym min guard. Completed 2 trials of rehab stairs at min guard level. No loss of balance observed.      Vision                     Perception     Praxis      Pertinent Vitals/Pain Pain Assessment: 0-10 Pain Score: 9  Pain Location: R shoulder Pain Descriptors / Indicators: Aching Pain Intervention(s): Limited activity within patient's tolerance;Monitored during session;Premedicated before session;Repositioned;Utilized relaxation techniques;Other (comment) (offered ice)     Hand Dominance     Extremity/Trunk Assessment Upper Extremity Assessment Upper Extremity Assessment: RUE deficits/detail RUE Deficits / Details: s/p R total shoulder arthroplasty RUE: Unable to fully assess due to immobilization   Lower Extremity Assessment Lower Extremity Assessment: Overall WFL for tasks assessed  Cervical / Trunk Assessment Cervical / Trunk Assessment: Normal   Communication Communication Communication: No difficulties   Cognition Arousal/Alertness: Awake/alert Behavior During Therapy: WFL for tasks assessed/performed;Impulsive Overall Cognitive Status: Within Functional Limits for tasks assessed                      General Comments       Exercises Exercises: Shoulder;Hand exercises     Shoulder Instructions Shoulder Instructions ROM for elbow, wrist and digits of operated UE: Supervision/safety Sling wearing schedule (on at all times/off for ADL's): Supervision/safety Positioning of UE while sleeping: Supervision/safety    Home Living Family/patient expects to be discharged to:: Private residence Living Arrangements: Other (Comment);Other relatives (lives with sister) Available Help at Discharge: Family;Available 24 hours/day Type of Home: House Home Access: Stairs to enter Entergy CorporationEntrance Stairs-Number of Steps: 8 (4 steps then a landing then 4 more steps) Entrance Stairs-Rails: Left Home Layout: Two level;Able to live on main level with bedroom/bathroom     Bathroom Shower/Tub: Chief Strategy OfficerTub/shower unit   Bathroom Toilet: Standard     Home Equipment: Bedside commode;Other (comment) (homemade cane)          Prior Functioning/Environment Level of Independence: Independent with assistive device(s)        Comments: pt initially reported occasional use of homemade cane, later he stated "I don't really need though"    OT Diagnosis: Acute pain   OT Problem List: Impaired balance (sitting and/or standing);Decreased knowledge of use of DME or AE;Decreased knowledge of precautions;Impaired UE functional use;Pain   OT Treatment/Interventions: Self-care/ADL training;Therapeutic exercise;DME and/or AE instruction;Therapeutic activities;Patient/family education;Balance training    OT Goals(Current goals can be found in the care plan section) Acute Rehab OT Goals Patient Stated Goal: not stated OT Goal Formulation: With patient Time For Goal Achievement: 11/08/13 Potential to Achieve Goals: Good ADL Goals Pt Will Perform Upper Body Bathing: with modified independence;sitting Pt Will Perform Upper Body Dressing: with modified independence;sitting Pt/caregiver will Perform  Home Exercise Program: Right Upper extremity;Independently;With written HEP provided  OT Frequency: Min 3X/week   Barriers to D/C:            Co-evaluation              End of Session Equipment Utilized During Treatment: Gait belt;Other (comment) (sling)  Activity Tolerance: Patient tolerated treatment well Patient left: in bed;with call bell/phone within reach   Time: [id number] OT Time Calculation (min): 47 min Charges:  OT General Charges $OT Visit: 1 Procedure OT Evaluation $Initial OT Evaluation Tier I: 1 Procedure OT Treatments $Self Care/Home Management : 8-22 mins $Therapeutic Exercise: 8-22 mins G-Codes:    Pilar GrammesMathews, Suzzette Gasparro H 11/01/2013, 9:52 AM

## 2013-11-01 NOTE — Progress Notes (Signed)
PT Cancellation Note  Patient Details Name: Keith Deleon MRN: [id number] DOB: 05/28/1955   Cancelled Treatment:    Reason Eval/Treat Not Completed: PT screened, no needs identified, will sign off (OT reports no PT needs, pt is ambulatory.)   Keith Deleon, Keith Deleon 11/01/2013, 9:43 AM Blanchard KelchKaren Filip Luten PT [phone]

## 2013-11-01 NOTE — Anesthesia Postprocedure Evaluation (Signed)
  Anesthesia Post-op Note  Patient: Keith RivesCarl L Deleon  Procedure(s) Performed: Procedure(s) with comments: RIGHT TOTAL SHOULDER ARTHROPLASTY (Right) - Right shoulder Hemi-arthroplasty  Patient Location: PACU  Anesthesia Type:General and Regional  Level of Consciousness: awake  Airway and Oxygen Therapy: Patient Spontanous Breathing  Post-op Pain: none  Post-op Assessment: Post-op Vital signs reviewed, Patient's Cardiovascular Status Stable, Respiratory Function Stable, Patent Airway, No signs of Nausea or vomiting and Pain level controlled  Post-op Vital Signs: Reviewed and stable  Last Vitals:  Filed Vitals:   11/01/13 0145  BP: 111/56  Pulse: 58  Temp: 37.4 C  Resp: 18    Complications: No apparent anesthesia complications

## 2013-11-03 ENCOUNTER — Encounter (HOSPITAL_COMMUNITY): Payer: Self-pay | Admitting: Orthopedic Surgery

## 2014-05-25 ENCOUNTER — Encounter (HOSPITAL_COMMUNITY): Payer: Self-pay | Admitting: Emergency Medicine

## 2014-05-25 ENCOUNTER — Emergency Department (HOSPITAL_COMMUNITY)
Admission: EM | Admit: 2014-05-25 | Discharge: 2014-05-25 | Disposition: A | Discharge status: Home / Self Care | Payer: Medicaid Other | Attending: Emergency Medicine | Admitting: Emergency Medicine

## 2014-05-25 DIAGNOSIS — Y998 Other external cause status: Secondary | ICD-10-CM | POA: Insufficient documentation

## 2014-05-25 DIAGNOSIS — S0501XA Injury of conjunctiva and corneal abrasion without foreign body, right eye, initial encounter: Secondary | ICD-10-CM | POA: Diagnosis not present

## 2014-05-25 DIAGNOSIS — Z8611 Personal history of tuberculosis: Secondary | ICD-10-CM | POA: Insufficient documentation

## 2014-05-25 DIAGNOSIS — Y9389 Activity, other specified: Secondary | ICD-10-CM | POA: Insufficient documentation

## 2014-05-25 DIAGNOSIS — Z79899 Other long term (current) drug therapy: Secondary | ICD-10-CM | POA: Insufficient documentation

## 2014-05-25 DIAGNOSIS — Z8619 Personal history of other infectious and parasitic diseases: Secondary | ICD-10-CM | POA: Diagnosis not present

## 2014-05-25 DIAGNOSIS — Y9289 Other specified places as the place of occurrence of the external cause: Secondary | ICD-10-CM | POA: Diagnosis not present

## 2014-05-25 DIAGNOSIS — X58XXXA Exposure to other specified factors, initial encounter: Secondary | ICD-10-CM | POA: Diagnosis not present

## 2014-05-25 DIAGNOSIS — Z720 Tobacco use: Secondary | ICD-10-CM | POA: Diagnosis not present

## 2014-05-25 DIAGNOSIS — H578 Other specified disorders of eye and adnexa: Secondary | ICD-10-CM | POA: Diagnosis present

## 2014-05-25 DIAGNOSIS — M1990 Unspecified osteoarthritis, unspecified site: Secondary | ICD-10-CM | POA: Diagnosis not present

## 2014-05-25 MED ORDER — CIPROFLOXACIN HCL 0.3 % OP SOLN
1.0000 [drp] | OPHTHALMIC | Status: DC
  Administered 2014-05-25: 1 [drp] via OPHTHALMIC
  Filled 2014-05-25: qty 2.5

## 2014-05-25 MED ORDER — FLUORESCEIN SODIUM 1 MG OP STRP
1.0000 | ORAL_STRIP | Freq: Once | OPHTHALMIC | Status: DC
  Filled 2014-05-25: qty 1

## 2014-05-25 MED ORDER — TETRACAINE HCL 0.5 % OP SOLN
2.0000 [drp] | Freq: Once | OPHTHALMIC | Status: AC
  Administered 2014-05-25: 2 [drp] via OPHTHALMIC
  Filled 2014-05-25: qty 2

## 2014-05-25 NOTE — Discharge Instructions (Signed)
Corneal Abrasion °The cornea is the clear covering at the front and center of the eye. When looking at the colored portion of the eye (iris), you are looking through the cornea. This very thin tissue is made up of many layers. The surface layer is a single layer of cells (corneal epithelium) and is one of the most sensitive tissues in the body. If a scratch or injury causes the corneal epithelium to come off, it is called a corneal abrasion. If the injury extends to the tissues below the epithelium, the condition is called a corneal ulcer. °CAUSES  °· Scratches. °· Trauma. °· Foreign body in the eye. °Some people have recurrences of abrasions in the area of the original injury even after it has healed (recurrent erosion syndrome). Recurrent erosion syndrome generally improves and goes away with time. °SYMPTOMS  °· Eye pain. °· Difficulty or inability to keep the injured eye open. °· The eye becomes very sensitive to light. °· Recurrent erosions tend to happen suddenly, first thing in the morning, usually after waking up and opening the eye. °DIAGNOSIS  °Your health care provider can diagnose a corneal abrasion during an eye exam. Dye is usually placed in the eye using a drop or a small paper strip moistened by your tears. When the eye is examined with a special light, the abrasion shows up clearly because of the dye. °TREATMENT  °· Small abrasions may be treated with antibiotic drops or ointment alone. °· A pressure patch may be put over the eye. If this is done, follow your doctor's instructions for when to remove the patch. Do not drive or use machines while the eye patch is on. Judging distances is hard to do with a patch on. °If the abrasion becomes infected and spreads to the deeper tissues of the cornea, a corneal ulcer can result. This is serious because it can cause corneal scarring. Corneal scars interfere with light passing through the cornea and cause a loss of vision in the involved eye. °HOME CARE  INSTRUCTIONS °· Use medicine or ointment as directed. Only take over-the-counter or prescription medicines for pain, discomfort, or fever as directed by your health care provider. °· Do not drive or operate machinery if your eye is patched. Your ability to judge distances is impaired. °· If your health care provider has given you a follow-up appointment, it is very important to keep that appointment. Not keeping the appointment could result in a severe eye infection or permanent loss of vision. If there is any problem keeping the appointment, let your health care provider know. °SEEK MEDICAL CARE IF:  °· You have pain, light sensitivity, and a scratchy feeling in one eye or both eyes. °· Your pressure patch keeps loosening up, and you can blink your eye under the patch after treatment. °· Any kind of discharge develops from the eye after treatment or if the lids stick together in the morning. °· You have the same symptoms in the morning as you did with the original abrasion days, weeks, or months after the abrasion healed. °MAKE SURE YOU:  °· Understand these instructions. °· Will watch your condition. °· Will get help right away if you are not doing well or get worse. °Document Released: 01/10/2000 Document Revised: 01/17/2013 Document Reviewed: 09/19/2012 °[employer]® Patient Information ©2015 [employer]. This information is not intended to replace advice given to you by your health care provider. Make sure you discuss any questions you have with your health care provider. ° °

## 2014-05-25 NOTE — ED Provider Notes (Signed)
CSN: [id number]     Arrival date & time 05/25/14  9528 History   First MD Initiated Contact with Patient 05/25/14 [phone]     Chief Complaint  Patient presents with  . Eye Problem     (Consider location/radiation/quality/duration/timing/severity/associated sxs/prior Treatment) HPI Comments: Pt comes in with c/o feeling like something in his right eye after mowing the grass yesterday;tetanus GMW:NUUVO'Z wear contact lenses  Patient is a 59 y.o. male presenting with eye problem. The history is provided by the patient. No language interpreter was used.  Eye Problem Location:  R eye Quality:  Aching Severity:  Mild Onset quality:  Sudden Duration:  1 day Timing:  Constant Progression:  Unchanged Relieved by:  Nothing Worsened by:  Eye movement and bright light Ineffective treatments:  Flushing Associated symptoms: itching     Past Medical History  Diagnosis Date  . Hepatitis C   . Tuberculosis 88    tb rx  . Arthritis   . 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 Right knee surgery      cartilage  . Total shoulder arthroplasty Right 10/31/2013    Procedure: RIGHT TOTAL SHOULDER ARTHROPLASTY;  Surgeon: Eulas Post, MD;  Location: [facility] OR;  Service: Orthopedics;  Laterality: Right;  Right shoulder Hemi-arthroplasty   No family history on file. History  Substance Use Topics  . Smoking status: Current Every Day Smoker -- 0.25 packs/day for 25 years    Types: Cigarettes  . Smokeless tobacco: Not on file  . Alcohol Use: Yes     Comment: 2-  40oz per day beer    Review of Systems  Eyes: Positive for itching.  All other systems reviewed and are negative.     Allergies  Chlorhexidine gluconate and Penicillins  Home Medications   Prior to Admission medications   Medication Sig Start Date End Date Taking? Authorizing Provider  baclofen (LIORESAL) 10 MG tablet Take 1 tablet (10 mg total) by mouth 3 (three) times  daily. As needed for muscle spasm 10/31/13   Teryl Lucy, MD  ondansetron (ZOFRAN) 4 MG tablet Take 1 tablet (4 mg total) by mouth every 8 (eight) hours as needed for nausea or vomiting. 10/31/13   Teryl Lucy, MD  oxyCODONE-acetaminophen (PERCOCET) 10-325 MG per tablet Take 1-2 tablets by mouth every 6 (six) hours as needed for pain. MAXIMUM TOTAL ACETAMINOPHEN DOSE IS 4000 MG PER DAY 10/31/13   Teryl Lucy, MD  sennosides-docusate sodium (SENOKOT-S) 8.6-50 MG tablet Take 2 tablets by mouth daily. 10/31/13   Teryl Lucy, MD   BP 172/90 mmHg  Pulse 64  Temp(Src) 98.8 F (37.1 C) (Oral)  Resp 18  SpO2 98% Physical Exam  Constitutional: He is oriented to person, place, and time. He appears well-developed and well-nourished.  HENT:  Head: Normocephalic.  Eyes: EOM are normal. Pupils are equal, round, and reactive to light. Right eye exhibits no exudate. Right conjunctiva is injected.  Slit lamp exam:      The right eye shows corneal abrasion and fluorescein uptake.    Cardiovascular: Normal rate and regular rhythm.   Pulmonary/Chest: Effort normal and breath sounds normal.  Musculoskeletal: Normal range of motion.  Neurological: He is alert and oriented to person, place, and time.  Skin: Skin is warm and dry.  Nursing note and vitals reviewed.   ED Course  Procedures (including critical care time) Labs Review Labs Reviewed - No data to display  Imaging Review No  results found.   EKG Interpretation None      MDM   Final diagnoses:  Corneal abrasion, right, initial encounter    Treated with cipro. Tetanus utd. Given optho follow up    Teressa LowerVrinda Kerrion Kemppainen, NP 05/25/14 [id number]  Mancel BaleElliott Wentz, MD 05/25/14 [phone]

## 2014-05-25 NOTE — ED Notes (Signed)
Patient states got "something in my R eye when I was mowing".   Patient complains of eye pain.  Patient wouldn't open his eye to examine.

## 2014-05-25 NOTE — ED Notes (Signed)
Checked patient eyes both eyes were 20/30 left eye 20/30 and right eye 20/30

## 2015-10-21 ENCOUNTER — Emergency Department (HOSPITAL_COMMUNITY): Payer: Medicaid Other

## 2015-10-21 ENCOUNTER — Emergency Department (HOSPITAL_COMMUNITY)
Admission: EM | Admit: 2015-10-21 | Discharge: 2015-10-21 | Disposition: A | Discharge status: Home / Self Care | Payer: Medicaid Other | Attending: Emergency Medicine | Admitting: Emergency Medicine

## 2015-10-21 ENCOUNTER — Encounter (HOSPITAL_COMMUNITY): Payer: Self-pay | Admitting: *Deleted

## 2015-10-21 DIAGNOSIS — Y9301 Activity, walking, marching and hiking: Secondary | ICD-10-CM | POA: Diagnosis not present

## 2015-10-21 DIAGNOSIS — M1711 Unilateral primary osteoarthritis, right knee: Secondary | ICD-10-CM

## 2015-10-21 DIAGNOSIS — Y999 Unspecified external cause status: Secondary | ICD-10-CM | POA: Diagnosis not present

## 2015-10-21 DIAGNOSIS — W548XXA Other contact with dog, initial encounter: Secondary | ICD-10-CM | POA: Insufficient documentation

## 2015-10-21 DIAGNOSIS — S8991XA Unspecified injury of right lower leg, initial encounter: Secondary | ICD-10-CM | POA: Diagnosis present

## 2015-10-21 DIAGNOSIS — Z96611 Presence of right artificial shoulder joint: Secondary | ICD-10-CM | POA: Insufficient documentation

## 2015-10-21 DIAGNOSIS — T1490XA Injury, unspecified, initial encounter: Secondary | ICD-10-CM

## 2015-10-21 DIAGNOSIS — F17210 Nicotine dependence, cigarettes, uncomplicated: Secondary | ICD-10-CM | POA: Diagnosis not present

## 2015-10-21 DIAGNOSIS — M25461 Effusion, right knee: Secondary | ICD-10-CM | POA: Diagnosis not present

## 2015-10-21 DIAGNOSIS — Y92137 Garden or yard on military base as the place of occurrence of the external cause: Secondary | ICD-10-CM | POA: Insufficient documentation

## 2015-10-21 DIAGNOSIS — M25561 Pain in right knee: Secondary | ICD-10-CM

## 2015-10-21 MED ORDER — HYDROCODONE-ACETAMINOPHEN 5-325 MG PO TABS
2.0000 | ORAL_TABLET | Freq: Once | ORAL | Status: AC
  Administered 2015-10-21: 2 via ORAL
  Filled 2015-10-21: qty 2

## 2015-10-21 MED ORDER — KETOROLAC TROMETHAMINE 60 MG/2ML IM SOLN
60.0000 mg | Freq: Once | INTRAMUSCULAR | Status: AC
  Administered 2015-10-21: 60 mg via INTRAMUSCULAR
  Filled 2015-10-21: qty 2

## 2015-10-21 MED ORDER — TRAMADOL HCL 50 MG PO TABS: 50.0000 mg | ORAL_TABLET | Freq: Four times a day (QID) | ORAL | 0 refills | Status: DC | PRN

## 2015-10-21 NOTE — ED Provider Notes (Signed)
[facility]-EMERGENCY DEPT Provider Note   CSN: [id number] Arrival date & time: 10/21/15  [id number]     History   Chief Complaint Chief Complaint  Patient presents with  . Knee Pain    HPI Keith RivesCarl L Fenster is a 60 y.o. male.  HPI  Reports his dog was running around in the yard last night, and ran into his right knee with immediate pain to the area. Reports that his knee seemed to bend backwards. Did not fall to the ground. However, describes it is been unable to ambulate since the incident. Pain is severe, 10 out of 10. He has not taken anything for the pain. Reports is difficult to move it. Has no history of fevers, gout or septic joints. He had surgery on the right knee in the past for cartilage.  Past Medical History:  Diagnosis Date  . Arthritis   . Hepatitis C   . Osteoarthritis of right shoulder 10/31/2013  . Tuberculosis 88   tb rx    Patient Active Problem List   Diagnosis Date Noted  . Osteoarthritis of right shoulder 10/31/2013  . Primary osteoarthritis of right shoulder 10/31/2013    Past Surgical History:  Procedure Laterality Date  . right knee surgery     cartilage  . TOTAL SHOULDER ARTHROPLASTY Right 10/31/2013   Procedure: RIGHT TOTAL SHOULDER ARTHROPLASTY;  Surgeon: Eulas PostJoshua P Landau, MD;  Location: [facility] OR;  Service: Orthopedics;  Laterality: Right;  Right shoulder Hemi-arthroplasty       Home Medications    Prior to Admission medications   Medication Sig Start Date End Date Taking? Authorizing Provider  baclofen (LIORESAL) 10 MG tablet Take 1 tablet (10 mg total) by mouth 3 (three) times daily. As needed for muscle spasm 10/31/13   Teryl LucyJoshua Landau, MD  ondansetron (ZOFRAN) 4 MG tablet Take 1 tablet (4 mg total) by mouth every 8 (eight) hours as needed for nausea or vomiting. 10/31/13   Teryl LucyJoshua Landau, MD  oxyCODONE-acetaminophen (PERCOCET) 10-325 MG per tablet Take 1-2 tablets by mouth every 6 (six) hours as needed for pain. MAXIMUM TOTAL ACETAMINOPHEN DOSE IS 4000 MG  PER DAY 10/31/13   Teryl LucyJoshua Landau, MD  sennosides-docusate sodium (SENOKOT-S) 8.6-50 MG tablet Take 2 tablets by mouth daily. 10/31/13   Teryl LucyJoshua Landau, MD  traMADol (ULTRAM) 50 MG tablet Take 1 tablet (50 mg total) by mouth every 6 (six) hours as needed. 10/21/15   Alvira MondayErin Tessi Eustache, MD    Family History History reviewed. No pertinent family history.  Social History Social History  Substance Use Topics  . Smoking status: Current Every Day Smoker    Packs/day: 0.25    Years: 25.00    Types: Cigarettes  . Smokeless tobacco: Never Used  . Alcohol use Yes     Comment: 2-  40oz per day beer     Allergies   Chlorhexidine gluconate and Penicillins   Review of Systems Review of Systems  Constitutional: Negative for fever.  Respiratory: Negative for shortness of breath.   Cardiovascular: Negative for chest pain.  Gastrointestinal: Negative for nausea and vomiting.  Genitourinary: Negative for difficulty urinating.  Musculoskeletal: Positive for arthralgias and gait problem.  Skin: Negative for wound.  Neurological: Negative for weakness, numbness and headaches.     Physical Exam Updated Vital Signs BP 162/89 (BP Location: Left Arm)   Pulse 68   Temp 98.1 F (36.7 C) (Oral)   Resp 14   Ht 5\' 8"  (1.727 m)   Wt 165 lb (74.8 kg)  SpO2 100%   BMI 25.09 kg/m   Physical Exam  Constitutional: He is oriented to person, place, and time. He appears well-developed and well-nourished. No distress.  HENT:  Head: Normocephalic and atraumatic.  Eyes: Conjunctivae and EOM are normal.  Neck: Normal range of motion.  Cardiovascular: Normal rate, regular rhythm and intact distal pulses.   Pulmonary/Chest: Effort normal and breath sounds normal. No respiratory distress.  Abdominal: He exhibits no distension.  Musculoskeletal: He exhibits no edema.       Right knee: He exhibits swelling and effusion. He exhibits normal range of motion and no ecchymosis. Lacerations: healed scar from prior  surgery. Tenderness found.  Neurological: He is alert and oriented to person, place, and time.  Skin: Skin is warm and dry. He is not diaphoretic.  Nursing note and vitals reviewed.    ED Treatments / Results  Labs (all labs ordered are listed, but only abnormal results are displayed) Labs Reviewed - No data to display  EKG  EKG Interpretation None       Radiology Dg Knee 1-2 Views Right  Result Date: 10/21/2015 CLINICAL DATA:  Severe right knee pain.  Unable to bear weight. EXAM: RIGHT KNEE - 1-2 VIEW COMPARISON:  12/03/2011 FINDINGS: Tricompartment joint space narrowing in the knee with extensive osteophytosis. In addition, there are diffuse calcifications in the suprapatellar joint region suggestive for synovial calcifications. There appears to be a large amount of fluid in the suprapatellar region. There are also small calcifications along the periphery of the medial and lateral joint spaces. Again noted is small metallic foreign body in the posterior soft tissues of the lower thigh. The synovial calcifications have progressed since the exam in 2013. There is no evidence for a fracture or dislocation. IMPRESSION: Severe degenerative changes in the right knee with large joint effusion. 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 No acute bone abnormality. Electronically Signed   By: Richarda Overlie M.D.   On: 10/21/2015 09:37    Procedures Procedures (including critical care time)  Medications Ordered in ED Medications  HYDROcodone-acetaminophen (NORCO/VICODIN) 5-325 MG per tablet 2 tablet (2 tablets Oral Given 10/21/15 0947)  ketorolac (TORADOL) injection 60 mg (60 mg Intramuscular Given 10/21/15 1237)     Initial Impression / Assessment and Plan / ED Course  I have reviewed the triage vital signs and the nursing notes.  Pertinent labs & imaging results that were available during my care of the patient were reviewed by me and considered  in my medical decision making (see chart for details).  Clinical Course   60yo male presents with pain to his right knee with immediate pain to the area worsening after is dog ran into him last night.  No sign of septic joint by hx or exam. XR shows no sign of fracture, however does show severe degenerative disease and effusion. Pt neuro intact, good pulses bilateraly.  Give knee sleeve, recommend ice, nsaidsand gave rx for tramadol and crutches and orthopedic follow up.  Final Clinical Impressions(s) / ED Diagnoses   Final diagnoses:  Injury  Right knee pain  Effusion of right knee  Primary osteoarthritis of right knee    New Prescriptions Discharge Medication List as of 10/21/2015  1:03 PM    START taking these medications   Details  traMADol (ULTRAM) 50 MG tablet Take 1 tablet (50 mg total) by mouth every 6 (six) hours as needed., Starting Mon 10/21/2015, Print         Murphy Oil,  MD 10/22/15 1610

## 2015-10-21 NOTE — ED Notes (Signed)
Patient was out walking his dog the dog got away and states he dog ran into his leg. C/o pain and swelling to knee. History of surgery in the past.

## 2016-11-27 ENCOUNTER — Encounter (HOSPITAL_COMMUNITY): Payer: Self-pay | Admitting: Emergency Medicine

## 2016-11-27 ENCOUNTER — Emergency Department (HOSPITAL_COMMUNITY): Payer: Medicaid Other

## 2016-11-27 ENCOUNTER — Emergency Department (HOSPITAL_COMMUNITY)
Admission: EM | Admit: 2016-11-27 | Discharge: 2016-11-27 | Disposition: A | Discharge status: Home / Self Care | Payer: Medicaid Other | Attending: Emergency Medicine | Admitting: Emergency Medicine

## 2016-11-27 DIAGNOSIS — Z96611 Presence of right artificial shoulder joint: Secondary | ICD-10-CM | POA: Diagnosis not present

## 2016-11-27 DIAGNOSIS — M62830 Muscle spasm of back: Secondary | ICD-10-CM | POA: Diagnosis not present

## 2016-11-27 DIAGNOSIS — F17210 Nicotine dependence, cigarettes, uncomplicated: Secondary | ICD-10-CM | POA: Insufficient documentation

## 2016-11-27 DIAGNOSIS — M549 Dorsalgia, unspecified: Secondary | ICD-10-CM | POA: Diagnosis present

## 2016-11-27 LAB — CBC
HCT: 41.2 % (ref 39.0–52.0)
Hemoglobin: 13.9 g/dL (ref 13.0–17.0)
MCH: 28.1 pg (ref 26.0–34.0)
MCHC: 33.7 g/dL (ref 30.0–36.0)
MCV: 83.4 fL (ref 78.0–100.0)
PLATELETS: 226 10*3/uL (ref 150–400)
RBC: 4.94 MIL/uL (ref 4.22–5.81)
RDW: 15.1 % (ref 11.5–15.5)
WBC: 6.8 10*3/uL (ref 4.0–10.5)

## 2016-11-27 LAB — URINALYSIS, ROUTINE W REFLEX MICROSCOPIC
Bilirubin Urine: NEGATIVE
Glucose, UA: NEGATIVE mg/dL
Hgb urine dipstick: NEGATIVE
Ketones, ur: NEGATIVE mg/dL
Leukocytes, UA: NEGATIVE
NITRITE: NEGATIVE
PH: 5 (ref 5.0–8.0)
Protein, ur: NEGATIVE mg/dL
SPECIFIC GRAVITY, URINE: 1.025 (ref 1.005–1.030)

## 2016-11-27 LAB — BASIC METABOLIC PANEL
ANION GAP: 8 (ref 5–15)
BUN: 15 mg/dL (ref 6–20)
CHLORIDE: 103 mmol/L (ref 101–111)
CO2: 27 mmol/L (ref 22–32)
Calcium: 8.8 mg/dL — ABNORMAL LOW (ref 8.9–10.3)
Creatinine, Ser: 1.05 mg/dL (ref 0.61–1.24)
GFR calc non Af Amer: >60 mL/min (ref >60)
Glucose, Bld: 130 mg/dL — ABNORMAL HIGH (ref 65–99)
POTASSIUM: 3.8 mmol/L (ref 3.5–5.1)
Sodium: 138 mmol/L (ref 135–145)

## 2016-11-27 MED ORDER — HYDROCODONE-ACETAMINOPHEN 5-325 MG PO TABS: 1.0000 | ORAL_TABLET | Freq: Four times a day (QID) | ORAL | 0 refills | Status: DC | PRN

## 2016-11-27 MED ORDER — HYDROCODONE-ACETAMINOPHEN 5-325 MG PO TABS
1.0000 | ORAL_TABLET | Freq: Once | ORAL | Status: AC
  Administered 2016-11-27: 1 via ORAL
  Filled 2016-11-27: qty 1

## 2016-11-27 NOTE — ED Triage Notes (Signed)
Pt c/o 10/10 mid back pain since he woke up this morning denies any fever, chills, nausea vomiting or urinary symptoms no recent injuries.

## 2016-11-27 NOTE — ED Notes (Signed)
EDP at bedside  
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
## 2016-12-04 ENCOUNTER — Emergency Department (HOSPITAL_COMMUNITY)
Admission: EM | Admit: 2016-12-04 | Discharge: 2016-12-04 | Disposition: A | Discharge status: Home / Self Care | Payer: Medicaid Other | Attending: Emergency Medicine | Admitting: Emergency Medicine

## 2016-12-04 ENCOUNTER — Other Ambulatory Visit: Payer: Self-pay

## 2016-12-04 ENCOUNTER — Encounter (HOSPITAL_COMMUNITY): Payer: Self-pay | Admitting: Emergency Medicine

## 2016-12-04 DIAGNOSIS — R05 Cough: Secondary | ICD-10-CM | POA: Insufficient documentation

## 2016-12-04 DIAGNOSIS — Z5321 Procedure and treatment not carried out due to patient leaving prior to being seen by health care provider: Secondary | ICD-10-CM | POA: Diagnosis not present

## 2016-12-04 DIAGNOSIS — R109 Unspecified abdominal pain: Secondary | ICD-10-CM | POA: Diagnosis present

## 2016-12-04 MED ORDER — ALBUTEROL SULFATE (2.5 MG/3ML) 0.083% IN NEBU: 5.0000 mg | INHALATION_SOLUTION | Freq: Once | RESPIRATORY_TRACT | Status: DC

## 2016-12-04 NOTE — ED Triage Notes (Signed)
Pt reports cough has continued and now his flank hurts, due to his cough per pt report.  Pt is not displaying any SOB unless he is going to sneeze.  Reports green mucus when he coughs.

## 2018-03-08 ENCOUNTER — Encounter (HOSPITAL_COMMUNITY): Payer: Self-pay

## 2018-03-08 ENCOUNTER — Emergency Department (HOSPITAL_COMMUNITY): Payer: Medicaid Other

## 2018-03-08 ENCOUNTER — Emergency Department (HOSPITAL_COMMUNITY)
Admission: EM | Admit: 2018-03-08 | Discharge: 2018-03-08 | Disposition: A | Discharge status: Home / Self Care | Payer: Medicaid Other | Attending: Emergency Medicine | Admitting: Emergency Medicine

## 2018-03-08 DIAGNOSIS — S42024A Nondisplaced fracture of shaft of right clavicle, initial encounter for closed fracture: Secondary | ICD-10-CM

## 2018-03-08 DIAGNOSIS — Y92410 Unspecified street and highway as the place of occurrence of the external cause: Secondary | ICD-10-CM | POA: Diagnosis not present

## 2018-03-08 DIAGNOSIS — F17210 Nicotine dependence, cigarettes, uncomplicated: Secondary | ICD-10-CM | POA: Diagnosis not present

## 2018-03-08 DIAGNOSIS — Y9355 Activity, bike riding: Secondary | ICD-10-CM | POA: Insufficient documentation

## 2018-03-08 DIAGNOSIS — S4991XA Unspecified injury of right shoulder and upper arm, initial encounter: Secondary | ICD-10-CM | POA: Diagnosis present

## 2018-03-08 DIAGNOSIS — Y998 Other external cause status: Secondary | ICD-10-CM | POA: Insufficient documentation

## 2018-03-08 DIAGNOSIS — W19XXXA Unspecified fall, initial encounter: Secondary | ICD-10-CM

## 2018-03-08 MED ORDER — HYDROCODONE-ACETAMINOPHEN 5-325 MG PO TABS
1.0000 | ORAL_TABLET | Freq: Once | ORAL | Status: AC
  Administered 2018-03-08: 1 via ORAL
  Filled 2018-03-08: qty 1

## 2018-03-08 MED ORDER — OXYCODONE HCL 5 MG PO TABS: 5.0000 mg | ORAL_TABLET | ORAL | 0 refills | Status: DC | PRN

## 2018-03-08 NOTE — ED Triage Notes (Signed)
Pt presents for evaluation of R shoulder pain after mechanical fall today. Reports hx of repair to that shoulder in past. Normal PMS. Limited ROM.
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
## 2018-03-08 NOTE — Discharge Instructions (Signed)
Please read instructions below. Apply ice to your right shoulder for 20 minutes at a time. You can take oxycodone every 6 hours as needed for severe pain. Do not drive or drink alcohol with this medication. You can take over the counter medications for pain as needed.  Schedule an appointment with your orthopedic specialist in 1 week for repeat x-ray and follow-up on your injury. Return to the ER for new or concerning symptoms.

## 2018-03-08 NOTE — Progress Notes (Signed)
Orthopedic Tech Progress Note Patient Details:  Keith Deleon July 23, 1955 [id number]  Ortho Devices Type of Ortho Device: Arm sling Ortho Device/Splint Interventions: Application   Post Interventions Patient Tolerated: Well Instructions Provided: Care of device   Saul Fordyce 03/08/2018, 1:31 PM

## 2018-03-08 NOTE — ED Notes (Signed)
Patient verbalizes understanding of discharge instructions. Opportunity for questioning and answers were provided. Armband removed by staff, pt discharged from ED.  

## 2018-05-23 ENCOUNTER — Emergency Department (HOSPITAL_COMMUNITY): Payer: Medicaid Other

## 2018-05-23 ENCOUNTER — Emergency Department (HOSPITAL_COMMUNITY)
Admission: EM | Admit: 2018-05-23 | Discharge: 2018-05-23 | Discharge status: Against Medical Advice | Payer: Medicaid Other | Attending: Emergency Medicine | Admitting: Emergency Medicine

## 2018-05-23 ENCOUNTER — Encounter (HOSPITAL_COMMUNITY): Payer: Self-pay | Admitting: Emergency Medicine

## 2018-05-23 DIAGNOSIS — R55 Syncope and collapse: Secondary | ICD-10-CM | POA: Diagnosis present

## 2018-05-23 DIAGNOSIS — F1410 Cocaine abuse, uncomplicated: Secondary | ICD-10-CM | POA: Diagnosis not present

## 2018-05-23 DIAGNOSIS — F1010 Alcohol abuse, uncomplicated: Secondary | ICD-10-CM | POA: Insufficient documentation

## 2018-05-23 DIAGNOSIS — Z5320 Procedure and treatment not carried out because of patient's decision for unspecified reasons: Secondary | ICD-10-CM | POA: Insufficient documentation

## 2018-05-23 DIAGNOSIS — F17210 Nicotine dependence, cigarettes, uncomplicated: Secondary | ICD-10-CM | POA: Diagnosis not present

## 2018-05-23 DIAGNOSIS — R4020 Unspecified coma: Secondary | ICD-10-CM

## 2018-05-23 LAB — CBC WITH DIFFERENTIAL/PLATELET
Abs Immature Granulocytes: 0.05 10*3/uL (ref 0.00–0.07)
Basophils Absolute: 0 10*3/uL (ref 0.0–0.1)
Basophils Relative: 0 %
Eosinophils Absolute: 0.1 10*3/uL (ref 0.0–0.5)
Eosinophils Relative: 1 %
HCT: 45.8 % (ref 39.0–52.0)
Hemoglobin: 14.6 g/dL (ref 13.0–17.0)
Immature Granulocytes: 1 %
Lymphocytes Relative: 22 %
Lymphs Abs: 1 10*3/uL (ref 0.7–4.0)
MCH: 26.7 pg (ref 26.0–34.0)
MCHC: 31.9 g/dL (ref 30.0–36.0)
MCV: 83.7 fL (ref 80.0–100.0)
Monocytes Absolute: 0.5 10*3/uL (ref 0.1–1.0)
Monocytes Relative: 11 %
Neutro Abs: 3 10*3/uL (ref 1.7–7.7)
Neutrophils Relative %: 65 %
Platelets: 233 10*3/uL (ref 150–400)
RBC: 5.47 MIL/uL (ref 4.22–5.81)
RDW: 15.7 % — ABNORMAL HIGH (ref 11.5–15.5)
WBC: 4.7 10*3/uL (ref 4.0–10.5)
nRBC: 0 % (ref 0.0–0.2)

## 2018-05-23 LAB — COMPREHENSIVE METABOLIC PANEL
ALT: 64 U/L — ABNORMAL HIGH (ref 0–44)
AST: 55 U/L — ABNORMAL HIGH (ref 15–41)
Albumin: 3.7 g/dL (ref 3.5–5.0)
Alkaline Phosphatase: 59 U/L (ref 38–126)
Anion gap: 15 (ref 5–15)
BUN: 15 mg/dL (ref 8–23)
CO2: 20 mmol/L — ABNORMAL LOW (ref 22–32)
Calcium: 8.7 mg/dL — ABNORMAL LOW (ref 8.9–10.3)
Chloride: 102 mmol/L (ref 98–111)
Creatinine, Ser: 1.12 mg/dL (ref 0.61–1.24)
GFR calc Af Amer: >60 mL/min (ref >60)
GFR calc non Af Amer: >60 mL/min (ref >60)
Glucose, Bld: 179 mg/dL — ABNORMAL HIGH (ref 70–99)
Potassium: 3.8 mmol/L (ref 3.5–5.1)
Sodium: 137 mmol/L (ref 135–145)
Total Bilirubin: 0.4 mg/dL (ref 0.3–1.2)
Total Protein: 7.6 g/dL (ref 6.5–8.1)

## 2018-05-23 LAB — RAPID URINE DRUG SCREEN, HOSP PERFORMED
Amphetamines: NOT DETECTED
Barbiturates: NOT DETECTED
Benzodiazepines: NOT DETECTED
Cocaine: POSITIVE — AB
Opiates: NOT DETECTED
Tetrahydrocannabinol: POSITIVE — AB

## 2018-05-23 LAB — TROPONIN I: Troponin I: <0.03 ng/mL (ref <0.03)

## 2018-05-23 LAB — ETHANOL: Alcohol, Ethyl (B): 68 mg/dL — ABNORMAL HIGH (ref <10)

## 2018-05-23 MED ORDER — SODIUM CHLORIDE 0.9 % IV BOLUS
1000.0000 mL | Freq: Once | INTRAVENOUS | Status: AC
  Administered 2018-05-23: 1000 mL via INTRAVENOUS

## 2018-05-23 NOTE — ED Notes (Signed)
PA at bedside explaining risks of leaving AMA as pt is dressed and states he wants to go home. Pt is steady on his feet.

## 2018-05-23 NOTE — ED Provider Notes (Signed)
Emergency Department Provider Note   I have reviewed the triage vital signs and the nursing notes.   HISTORY  Chief Complaint Loss of Consciousness   HPI Keith RivesCarl L Deleon is a 63 y.o. male with PMH of Hep C and Arthritis presents to the emergency department after being found unresponsive.  He was evaluated by EMS initially on scene.  He was unconscious and had decreased respiratory effort.  They initially gave intranasal Narcan which did not improve the patient's symptoms.  He was transported and had an IV placed.  IV Narcan 1 mg was given and the patient's mental status improved significantly.  He did require assist ventilations in route.  On arrival the patient is awake and alert.  He tells me he was snorting cocaine and drank 2 beers today.  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  He does not recall having chest pain, or palpitations, shortness of breath.  He is not having pain at this time. No numbness/weakness.   Past Medical History:  Diagnosis Date   Arthritis    Hepatitis C    Osteoarthritis of right shoulder 10/31/2013   Tuberculosis 88   tb rx    Patient Active Problem List   Diagnosis Date Noted   Osteoarthritis of right shoulder 10/31/2013   Primary osteoarthritis of right shoulder 10/31/2013    Past Surgical History:  Procedure Laterality Date   right knee surgery     cartilage   TOTAL SHOULDER ARTHROPLASTY Right 10/31/2013   Procedure: RIGHT TOTAL SHOULDER ARTHROPLASTY;  Surgeon: Eulas PostJoshua P Landau, MD;  Location: [facility] OR;  Service: Orthopedics;  Laterality: Right;  Right shoulder Hemi-arthroplasty    Allergies Chlorhexidine gluconate and Penicillins  No family history on file.  Social History Social History   Tobacco Use   Smoking status: Current Every Day Smoker    Packs/day: 0.25    Years: 25.00    Pack years: 6.25    Types: Cigarettes   Smokeless tobacco: Never Used  Substance Use Topics   Alcohol  use: Yes    Comment: 2-  40oz per day beer   Drug use: Yes    Types: Marijuana    Comment: last  time 10/17/13    Review of Systems  Constitutional: No fever/chills Eyes: No visual changes. ENT: No sore throat. Cardiovascular: Denies chest pain. Respiratory: Denies shortness of breath. Gastrointestinal: No abdominal pain.  No nausea, no vomiting.  No diarrhea.  No constipation. Genitourinary: Negative for dysuria. Musculoskeletal: Negative for back pain. Skin: Negative for rash. Neurological: Negative for headaches, focal weakness or numbness.  10-point ROS otherwise negative.  ____________________________________________   PHYSICAL EXAM:  VITAL SIGNS: ED Triage Vitals  Enc Vitals Group     BP 05/23/18 1359 (!) 151/87     Pulse Rate 05/23/18 1359 80     Resp 05/23/18 1359 20     Temp 05/23/18 1404 98.6 F (37 C)     Temp Source 05/23/18 1404 Oral     SpO2 05/23/18 1359 98 %   Constitutional: Alert and oriented. Well appearing and in no acute distress. Eyes: Conjunctivae are normal. PERRL.  Head: Atraumatic. Nose: No congestion/rhinnorhea. Mouth/Throat: Mucous membranes are moist.  Neck: No stridor. C-collar in place.  Cardiovascular: Normal rate, regular rhythm. Good peripheral circulation. Grossly normal heart sounds.   Respiratory: Normal respiratory effort.  No retractions. Lungs CTAB. Gastrointestinal: Soft and nontender. No distention.  Musculoskeletal: No lower extremity tenderness nor edema. No  gross deformities of extremities. Neurologic:  Normal speech and language. No gross focal neurologic deficits are appreciated.  Skin:  Skin is warm, dry and intact. No rash noted.  ____________________________________________   LABS (all labs ordered are listed, but only abnormal results are displayed)  Labs Reviewed  COMPREHENSIVE METABOLIC PANEL - Abnormal; Notable for the following components:      Result Value   CO2 20 (*)    Glucose, Bld 179 (*)     Calcium 8.7 (*)    AST 55 (*)    ALT 64 (*)    All other components within normal limits  CBC WITH DIFFERENTIAL/PLATELET - Abnormal; Notable for the following components:   RDW 15.7 (*)    All other components within normal limits  ETHANOL - Abnormal; Notable for the following components:   Alcohol, Ethyl (B) 68 (*)    All other components within normal limits  RAPID URINE DRUG SCREEN, HOSP PERFORMED - Abnormal; Notable for the following components:   Cocaine POSITIVE (*)    Tetrahydrocannabinol POSITIVE (*)    All other components within normal limits  TROPONIN I  TROPONIN I   ____________________________________________  EKG   EKG Interpretation  Date/Time:  Monday May 23 2018 13:57:16 EDT Ventricular Rate:  77 PR Interval:    QRS Duration: 115 QT Interval:  418 QTC Calculation: 474 R Axis:   37 Text Interpretation:  Sinus rhythm Biatrial enlargement Nonspecific intraventricular conduction delay Similar to prior.  No STEMI.  Confirmed by Alona Bene [phone]) on 05/23/2018 2:21:13 PM       ____________________________________________  RADIOLOGY  Ct Head Wo Contrast  Result Date: 05/23/2018 CLINICAL DATA:  Loss of consciousness EXAM: CT HEAD WITHOUT CONTRAST CT CERVICAL SPINE WITHOUT CONTRAST TECHNIQUE: Multidetector CT imaging of the head and cervical spine was performed following the standard protocol without intravenous contrast. Multiplanar CT image reconstructions of the cervical spine were also generated. COMPARISON:  04/11/2013 FINDINGS: CT HEAD FINDINGS Brain: No evidence of acute infarction, hemorrhage, hydrocephalus, extra-axial collection or mass lesion/mass effect. Periventricular white matter hypodensity. Vascular: No hyperdense vessel or unexpected calcification. Skull: Normal. Negative for fracture or focal lesion. Sinuses/Orbits: No acute finding. Other: None. CT CERVICAL SPINE FINDINGS Alignment: Degenerative straightening of the normal cervical lordosis.  Skull base and vertebrae: No acute fracture. No primary bone lesion or focal pathologic process. Soft tissues and spinal canal: No prevertebral fluid or swelling. No visible canal hematoma. Disc levels: Moderate multilevel disc degenerative disease and osteophytosis. Upper chest: Negative. Other: None. IMPRESSION: 1. No acute intracranial pathology. Small-vessel white matter disease. 2. No fracture or static subluxation of the cervical spine. Multilevel disc degenerative disease. Electronically Signed   By: Lauralyn Primes M.D.   On: 05/23/2018 15:36   Ct Cervical Spine Wo Contrast  Result Date: 05/23/2018 CLINICAL DATA:  Loss of consciousness EXAM: CT HEAD WITHOUT CONTRAST CT CERVICAL SPINE WITHOUT CONTRAST TECHNIQUE: Multidetector CT imaging of the head and cervical spine was performed following the standard protocol without intravenous contrast. Multiplanar CT image reconstructions of the cervical spine were also generated. COMPARISON:  04/11/2013 FINDINGS: CT HEAD FINDINGS Brain: No evidence of acute infarction, hemorrhage, hydrocephalus, extra-axial collection or mass lesion/mass effect. Periventricular white matter hypodensity. Vascular: No hyperdense vessel or unexpected calcification. Skull: Normal. Negative for fracture or focal lesion. Sinuses/Orbits: No acute finding. Other: None. CT CERVICAL SPINE FINDINGS Alignment: Degenerative straightening of the normal cervical lordosis. Skull base and vertebrae: No acute fracture. No primary bone lesion or focal pathologic process. Soft  tissues and spinal canal: No prevertebral fluid or swelling. No visible canal hematoma. Disc levels: Moderate multilevel disc degenerative disease and osteophytosis. Upper chest: Negative. Other: None. IMPRESSION: 1. No acute intracranial pathology. Small-vessel white matter disease. 2. No fracture or static subluxation of the cervical spine. Multilevel disc degenerative disease. Electronically Signed   By: Lauralyn Primes M.D.   On:  05/23/2018 15:36   Dg Chest Portable 1 View  Result Date: 05/23/2018 CLINICAL DATA:  Current smoker with personal history of tuberculosis, presenting with syncope. EXAM: PORTABLE CHEST 1 VIEW COMPARISON:  11/27/2016 and earlier. FINDINGS: Cardiac silhouette mildly enlarged, unchanged since 2018, increased in size since 2015. Thoracic aorta tortuous and mildly atherosclerotic, unchanged. Hilar and mediastinal contours otherwise unremarkable. Lungs clear. Bronchovascular markings normal. Pulmonary vascularity normal. No visible pleural effusions. No pneumothorax. Prior RIGHT shoulder hemiarthroplasty. IMPRESSION: Stable mild cardiomegaly. No acute cardiopulmonary disease. Electronically Signed   By: Hulan Saas M.D.   On: 05/23/2018 14:47    ____________________________________________   PROCEDURES  Procedure(s) performed:   Procedures  None ____________________________________________   INITIAL IMPRESSION / ASSESSMENT AND PLAN / ED COURSE  Pertinent labs & imaging results that were available during my care of the patient were reviewed by me and considered in my medical decision making (see chart for details).   Patient presents to the emergency department for evaluation after being found unresponsive.  He admits to using cocaine and drinking alcohol.  He did not intentionally use opiates.  He does not inject IV drugs.  He is neurologically intact.  He is not having any symptoms.  Plan for continued monitoring, IV fluids.  Will obtain chest x-ray with syncope.  As the patient was found down I do plan to obtain a CT scan of the head and cervical spine as well.  Maintain with cervical collar until cleared by that CT.  CXR and labs reviewed. CT imaging pending. Plan for obs in the ED pending imaging and clinical sobering. Care transferred to [facility] and Dr. Adela Lank pending imaging and reassessment.  ____________________________________________  FINAL CLINICAL IMPRESSION(S) / ED  DIAGNOSES  Final diagnoses:  LOC (loss of consciousness) (HCC)  Cocaine abuse (HCC)  Alcohol abuse     MEDICATIONS GIVEN DURING THIS VISIT:  Medications  sodium chloride 0.9 % bolus 1,000 mL (0 mLs Intravenous Stopped 05/23/18 1706)    Note:  This document was prepared using Dragon voice recognition software and may include unintentional dictation errors.  Alona Bene, MD Emergency Medicine    Kazia Grisanti, Arlyss Repress, MD 05/23/18 2010

## 2018-05-23 NOTE — ED Provider Notes (Signed)
Care assumed from Dr. Cathi Roan, please see his note for full details, but in brief Keith Deleon is a 63 y.o. male who was brought in by EMS after he was found unresponsive, patient was given Narcan and then returned to baseline.  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  Patient has remained stable during his ED stay, labs CT head and neck and chest x-ray pending if these are normal patient can likely be discharged home.  He denies any acute complaints at this time.  Plan: Follow-up on labs and imaging and ensure that patient remains alert and oriented and at baseline.  Physical Exam  BP (!) 145/106   Pulse 61   Temp 98.6 F (37 C) (Oral)   Resp 11   SpO2 95%   Physical Exam Vitals signs and nursing note reviewed.  Constitutional:      General: He is not in acute distress.    Appearance: He is well-developed and normal weight. He is not diaphoretic.  HENT:     Head: Normocephalic and atraumatic.  Eyes:     General:        Right eye: No discharge.        Left eye: No discharge.  Cardiovascular:     Rate and Rhythm: Normal rate and regular rhythm.     Heart sounds: Normal heart sounds.  Pulmonary:     Effort: Pulmonary effort is normal. No respiratory distress.     Breath sounds: Normal breath sounds.  Skin:    General: Skin is warm and dry.  Neurological:     Mental Status: He is alert and oriented to person, place, and time.     Coordination: Coordination normal.     Comments: Speech is clear, able to follow commands Moves extremities without ataxia, coordination intact Ambulatory with steady gait  Psychiatric:        Mood and Affect: Mood normal.        Behavior: Behavior normal.     ED Course/Procedures   Labs Reviewed  COMPREHENSIVE METABOLIC PANEL - Abnormal; Notable for the following components:      Result Value   CO2 20 (*)    Glucose, Bld 179 (*)    Calcium  8.7 (*)    AST 55 (*)    ALT 64 (*)    All other components within normal limits  CBC WITH DIFFERENTIAL/PLATELET - Abnormal; Notable for the following components:   RDW 15.7 (*)    All other components within normal limits  ETHANOL - Abnormal; Notable for the following components:   Alcohol, Ethyl (B) 68 (*)    All other components within normal limits  RAPID URINE DRUG SCREEN, HOSP PERFORMED - Abnormal; Notable for the following components:   Cocaine POSITIVE (*)    Tetrahydrocannabinol POSITIVE (*)    All other components within normal limits  TROPONIN I  TROPONIN I   Ct Head Wo Contrast  Result Date: 05/23/2018 CLINICAL DATA:  Loss of consciousness EXAM: CT HEAD WITHOUT CONTRAST CT CERVICAL SPINE WITHOUT CONTRAST TECHNIQUE: Multidetector CT imaging of the head and cervical spine was performed following the standard protocol without intravenous contrast. Multiplanar CT image reconstructions of the cervical spine were also generated. COMPARISON:  04/11/2013 FINDINGS: CT HEAD FINDINGS Brain: No evidence of acute infarction, hemorrhage, hydrocephalus, extra-axial collection or mass lesion/mass effect. Periventricular white matter hypodensity. Vascular:  No hyperdense vessel or unexpected calcification. Skull: Normal. Negative for fracture or focal lesion. Sinuses/Orbits: No acute finding. Other: None. CT CERVICAL SPINE FINDINGS Alignment: Degenerative straightening of the normal cervical lordosis. Skull base and vertebrae: No acute fracture. No primary bone lesion or focal pathologic process. Soft tissues and spinal canal: No prevertebral fluid or swelling. No visible canal hematoma. Disc levels: Moderate multilevel disc degenerative disease and osteophytosis. Upper chest: Negative. Other: None. IMPRESSION: 1. No acute intracranial pathology. Small-vessel white matter disease. 2. No fracture or static subluxation of the cervical spine. Multilevel disc degenerative disease. Electronically Signed    By: Lauralyn PrimesAlex  Bibbey M.D.   On: 05/23/2018 15:36   Ct Cervical Spine Wo Contrast  Result Date: 05/23/2018 CLINICAL DATA:  Loss of consciousness EXAM: CT HEAD WITHOUT CONTRAST CT CERVICAL SPINE WITHOUT CONTRAST TECHNIQUE: Multidetector CT imaging of the head and cervical spine was performed following the standard protocol without intravenous contrast. Multiplanar CT image reconstructions of the cervical spine were also generated. COMPARISON:  04/11/2013 FINDINGS: CT HEAD FINDINGS Brain: No evidence of acute infarction, hemorrhage, hydrocephalus, extra-axial collection or mass lesion/mass effect. Periventricular white matter hypodensity. Vascular: No hyperdense vessel or unexpected calcification. Skull: Normal. Negative for fracture or focal lesion. Sinuses/Orbits: No acute finding. Other: None. CT CERVICAL SPINE FINDINGS Alignment: Degenerative straightening of the normal cervical lordosis. Skull base and vertebrae: No acute fracture. No primary bone lesion or focal pathologic process. Soft tissues and spinal canal: No prevertebral fluid or swelling. No visible canal hematoma. Disc levels: Moderate multilevel disc degenerative disease and osteophytosis. Upper chest: Negative. Other: None. IMPRESSION: 1. No acute intracranial pathology. Small-vessel white matter disease. 2. No fracture or static subluxation of the cervical spine. Multilevel disc degenerative disease. Electronically Signed   By: Lauralyn PrimesAlex  Bibbey M.D.   On: 05/23/2018 15:36   Dg Chest Portable 1 View  Result Date: 05/23/2018 CLINICAL DATA:  Current smoker with personal history of tuberculosis, presenting with syncope. EXAM: PORTABLE CHEST 1 VIEW COMPARISON:  11/27/2016 and earlier. FINDINGS: Cardiac silhouette mildly enlarged, unchanged since 2018, increased in size since 2015. Thoracic aorta tortuous and mildly atherosclerotic, unchanged. Hilar and mediastinal contours otherwise unremarkable. Lungs clear. Bronchovascular markings normal. Pulmonary  vascularity normal. No visible pleural effusions. No pneumothorax. Prior RIGHT shoulder hemiarthroplasty. IMPRESSION: Stable mild cardiomegaly. No acute cardiopulmonary disease. Electronically Signed   By: Hulan Saashomas  Lawrence M.D.   On: 05/23/2018 14:47    EKG Interpretation  Date/Time:  Monday May 23 2018 13:57:16 EDT Ventricular Rate:  77 PR Interval:    QRS Duration: 115 QT Interval:  418 QTC Calculation: 474 R Axis:   37 Text Interpretation:  Sinus rhythm Biatrial enlargement Nonspecific intraventricular conduction delay Similar to prior.  No STEMI.  Confirmed by Alona BeneLong, Joshua [phone]([id number]) on 05/23/2018 2:21:13 PM   Procedures  MDM    Care assumed from Dr. Jacqulyn BathLong.  On my assessment patient is well-appearing and in no acute distress.  His work-up has been reassuring overall CT of the head and C-spine is unremarkable and chest x-ray is unremarkable as well.  Patient's labs show no leukocytosis, normal hemoglobin, CO2 of 20 glucose slightly elevated at 179 but no other acute electrolyte derangements requiring intervention, normal renal function, LFTs slightly elevated patient has a known history of alcohol use and has an ethanol of 68 today and I suspect this is the cause of this is patient does not have focal right upper quadrant tenderness.  His initial troponin was negative and EKG was similar to previous with  sinus rhythm.  UDS positive for cocaine and THC.  C-collar was removed.  Was alerted by nursing staff that patient wishes to leave, had initially plan to perform delta troponin given heavy cocaine use, patient is currently denying chest pain.  He is alert and oriented and has decision-making capacity and would like to leave and does not wish to stay for repeat troponin.  He understands that I cannot assure there is no stress on his heart and by leaving AGAINST MEDICAL ADVICE risks include death, significant illness or disability, despite this he would still like to leave AGAINST MEDICAL ADVICE.   I have provided him with strict return precautions and counseled him on reducing cocaine use.  He expresses understanding got himself dressed and ambulated from the department with steady gait.  Final diagnoses:  LOC (loss of consciousness) (HCC)  Cocaine abuse (HCC)  Alcohol abuse      Legrand Rams 05/23/18 1943    Maia Plan, MD 05/23/18 2023

## 2018-05-23 NOTE — ED Notes (Signed)
ED Provider at bedside. 

## 2018-05-23 NOTE — ED Triage Notes (Addendum)
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 On ems arrival pt was unconscious and decreased respiratory effort.  Pt was bagged and given 1 mg narcan IN and then repeat dose of 1mg  IV- pt became responsive before getting to ED- pt arrives alert and oriented to person and place. Pt states he drinks daily and has had 2 beers today, pt also reports doing "crack" today.

## 2018-11-01 ENCOUNTER — Encounter (HOSPITAL_COMMUNITY): Payer: Self-pay

## 2018-11-01 ENCOUNTER — Emergency Department (HOSPITAL_COMMUNITY)
Admission: EM | Admit: 2018-11-01 | Discharge: 2018-11-01 | Disposition: A | Discharge status: Home / Self Care | Payer: Medicaid Other | Attending: Emergency Medicine | Admitting: Emergency Medicine

## 2018-11-01 ENCOUNTER — Emergency Department (HOSPITAL_COMMUNITY): Payer: Medicaid Other

## 2018-11-01 ENCOUNTER — Other Ambulatory Visit: Payer: Self-pay

## 2018-11-01 DIAGNOSIS — M79604 Pain in right leg: Secondary | ICD-10-CM | POA: Diagnosis present

## 2018-11-01 DIAGNOSIS — J181 Lobar pneumonia, unspecified organism: Secondary | ICD-10-CM | POA: Diagnosis not present

## 2018-11-01 DIAGNOSIS — Z20828 Contact with and (suspected) exposure to other viral communicable diseases: Secondary | ICD-10-CM | POA: Insufficient documentation

## 2018-11-01 DIAGNOSIS — F17210 Nicotine dependence, cigarettes, uncomplicated: Secondary | ICD-10-CM | POA: Diagnosis not present

## 2018-11-01 DIAGNOSIS — J189 Pneumonia, unspecified organism: Secondary | ICD-10-CM

## 2018-11-01 DIAGNOSIS — R1084 Generalized abdominal pain: Secondary | ICD-10-CM | POA: Diagnosis not present

## 2018-11-01 LAB — URINALYSIS, ROUTINE W REFLEX MICROSCOPIC
Bilirubin Urine: NEGATIVE
Glucose, UA: NEGATIVE mg/dL
Hgb urine dipstick: NEGATIVE
Ketones, ur: NEGATIVE mg/dL
Leukocytes,Ua: NEGATIVE
Nitrite: NEGATIVE
Protein, ur: NEGATIVE mg/dL
Specific Gravity, Urine: 1.006 (ref 1.005–1.030)
pH: 5 (ref 5.0–8.0)

## 2018-11-01 LAB — CBC WITH DIFFERENTIAL/PLATELET
Abs Immature Granulocytes: 0.02 10*3/uL (ref 0.00–0.07)
Basophils Absolute: 0 10*3/uL (ref 0.0–0.1)
Basophils Relative: 0 %
Eosinophils Absolute: 0 10*3/uL (ref 0.0–0.5)
Eosinophils Relative: 0 %
HCT: 45.1 % (ref 39.0–52.0)
Hemoglobin: 14.4 g/dL (ref 13.0–17.0)
Immature Granulocytes: 0 %
Lymphocytes Relative: 16 %
Lymphs Abs: 1.3 10*3/uL (ref 0.7–4.0)
MCH: 27.2 pg (ref 26.0–34.0)
MCHC: 31.9 g/dL (ref 30.0–36.0)
MCV: 85.1 fL (ref 80.0–100.0)
Monocytes Absolute: 1 10*3/uL (ref 0.1–1.0)
Monocytes Relative: 13 %
Neutro Abs: 5.6 10*3/uL (ref 1.7–7.7)
Neutrophils Relative %: 71 %
Platelets: 183 10*3/uL (ref 150–400)
RBC: 5.3 MIL/uL (ref 4.22–5.81)
RDW: 14.6 % (ref 11.5–15.5)
WBC: 8 10*3/uL (ref 4.0–10.5)
nRBC: 0 % (ref 0.0–0.2)

## 2018-11-01 LAB — I-STAT CHEM 8, ED
BUN: 13 mg/dL (ref 8–23)
Calcium, Ion: 1.11 mmol/L — ABNORMAL LOW (ref 1.15–1.40)
Chloride: 100 mmol/L (ref 98–111)
Creatinine, Ser: 0.9 mg/dL (ref 0.61–1.24)
Glucose, Bld: 112 mg/dL — ABNORMAL HIGH (ref 70–99)
HCT: 48 % (ref 39.0–52.0)
Hemoglobin: 16.3 g/dL (ref 13.0–17.0)
Potassium: 3.5 mmol/L (ref 3.5–5.1)
Sodium: 136 mmol/L (ref 135–145)
TCO2: 20 mmol/L — ABNORMAL LOW (ref 22–32)

## 2018-11-01 LAB — HEPATIC FUNCTION PANEL
ALT: 102 U/L — ABNORMAL HIGH (ref 0–44)
AST: 95 U/L — ABNORMAL HIGH (ref 15–41)
Albumin: 3.5 g/dL (ref 3.5–5.0)
Alkaline Phosphatase: 60 U/L (ref 38–126)
Bilirubin, Direct: 0.3 mg/dL — ABNORMAL HIGH (ref 0.0–0.2)
Indirect Bilirubin: 0.6 mg/dL (ref 0.3–0.9)
Total Bilirubin: 0.9 mg/dL (ref 0.3–1.2)
Total Protein: 7.1 g/dL (ref 6.5–8.1)

## 2018-11-01 LAB — SARS CORONAVIRUS 2 BY RT PCR (HOSPITAL ORDER, PERFORMED IN ~~LOC~~ HOSPITAL LAB): SARS Coronavirus 2: NEGATIVE

## 2018-11-01 MED ORDER — LIDOCAINE 5 % EX PTCH
2.0000 | MEDICATED_PATCH | CUTANEOUS | Status: DC
  Administered 2018-11-01: 2 via TRANSDERMAL
  Filled 2018-11-01: qty 2

## 2018-11-01 MED ORDER — HYDROMORPHONE HCL 1 MG/ML IJ SOLN
1.0000 mg | Freq: Once | INTRAMUSCULAR | Status: AC
  Administered 2018-11-01: 11:00:00 1 mg via INTRAVENOUS
  Filled 2018-11-01: qty 1

## 2018-11-01 MED ORDER — SODIUM CHLORIDE (PF) 0.9 % IJ SOLN
INTRAMUSCULAR | Status: AC
  Filled 2018-11-01: qty 50

## 2018-11-01 MED ORDER — SODIUM CHLORIDE 0.9 % IV BOLUS
1000.0000 mL | Freq: Once | INTRAVENOUS | Status: AC
  Administered 2018-11-01: 1000 mL via INTRAVENOUS

## 2018-11-01 MED ORDER — LEVOFLOXACIN 500 MG PO TABS: 500.0000 mg | ORAL_TABLET | Freq: Every day | ORAL | 0 refills | Status: DC

## 2018-11-01 MED ORDER — KETOROLAC TROMETHAMINE 60 MG/2ML IM SOLN
60.0000 mg | Freq: Once | INTRAMUSCULAR | Status: AC
  Administered 2018-11-01: 05:00:00 60 mg via INTRAMUSCULAR
  Filled 2018-11-01: qty 2

## 2018-11-01 MED ORDER — METHOCARBAMOL 1000 MG/10ML IJ SOLN
1000.0000 mg | Freq: Once | INTRAMUSCULAR | Status: AC
  Administered 2018-11-01: 1000 mg via INTRAMUSCULAR
  Filled 2018-11-01: qty 10

## 2018-11-01 MED ORDER — IOHEXOL 300 MG/ML  SOLN
100.0000 mL | Freq: Once | INTRAMUSCULAR | Status: AC | PRN
  Administered 2018-11-01: 10:00:00 100 mL via INTRAVENOUS

## 2018-11-01 MED ORDER — LEVOFLOXACIN IN D5W 500 MG/100ML IV SOLN
500.0000 mg | Freq: Once | INTRAVENOUS | Status: AC
  Administered 2018-11-01: 07:00:00 500 mg via INTRAVENOUS
  Filled 2018-11-01: qty 100

## 2018-11-01 MED ORDER — ACETAMINOPHEN 500 MG PO TABS
1000.0000 mg | ORAL_TABLET | Freq: Once | ORAL | Status: AC
  Administered 2018-11-01: 1000 mg via ORAL
  Filled 2018-11-01: qty 2

## 2018-11-01 NOTE — ED Notes (Signed)
IV site was clean and intact, no swelling or redness noted.

## 2018-11-01 NOTE — ED Notes (Signed)
Pt transported to XR.  
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
## 2018-11-01 NOTE — ED Notes (Signed)
XR at bedside

## 2018-11-01 NOTE — ED Triage Notes (Signed)
Per ems: Pt coming from home c/o lower right side back pain that radiates down to lower leg that started 4pm yesterday. No injury. No recent falls. No burning with urination.    99.1 temp 190 palpated  80 hr 18 rr 95% room air

## 2018-11-01 NOTE — ED Notes (Signed)
EMS brought pt's social security card. Social security card placed in pt's belonging bag, along with pt's pants, socks, and shoes. New linen was placed on pt, pt was sweating. Temperature obtained and pt given cold rag.
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
## 2018-11-01 NOTE — ED Notes (Signed)
Kohut, MD at bedside and made aware of patient sweating and new temp.   Patient attempting to void in urinal at this time.

## 2018-11-04 ENCOUNTER — Other Ambulatory Visit: Payer: Self-pay

## 2018-11-04 ENCOUNTER — Encounter (HOSPITAL_COMMUNITY): Payer: Self-pay

## 2018-11-04 ENCOUNTER — Emergency Department (HOSPITAL_COMMUNITY): Payer: Medicaid Other

## 2018-11-04 ENCOUNTER — Emergency Department (HOSPITAL_COMMUNITY)
Admission: EM | Admit: 2018-11-04 | Discharge: 2018-11-04 | Disposition: A | Discharge status: Home / Self Care | Payer: Medicaid Other | Attending: Emergency Medicine | Admitting: Emergency Medicine

## 2018-11-04 DIAGNOSIS — M109 Gout, unspecified: Secondary | ICD-10-CM | POA: Diagnosis not present

## 2018-11-04 DIAGNOSIS — M25561 Pain in right knee: Secondary | ICD-10-CM | POA: Diagnosis not present

## 2018-11-04 DIAGNOSIS — Z79899 Other long term (current) drug therapy: Secondary | ICD-10-CM | POA: Insufficient documentation

## 2018-11-04 DIAGNOSIS — F17210 Nicotine dependence, cigarettes, uncomplicated: Secondary | ICD-10-CM | POA: Insufficient documentation

## 2018-11-04 DIAGNOSIS — M1120 Other chondrocalcinosis, unspecified site: Secondary | ICD-10-CM

## 2018-11-04 HISTORY — DX: Pain in unspecified knee: M25.569

## 2018-11-04 LAB — CBG MONITORING, ED: Glucose-Capillary: 115 mg/dL — ABNORMAL HIGH (ref 70–99)

## 2018-11-04 LAB — SYNOVIAL CELL COUNT + DIFF, W/ CRYSTALS
Eosinophils-Synovial: 0 % (ref 0–1)
Lymphocytes-Synovial Fld: 38 % — ABNORMAL HIGH (ref 0–20)
Monocyte-Macrophage-Synovial Fluid: 0 % — ABNORMAL LOW (ref 50–90)
Neutrophil, Synovial: 62 % — ABNORMAL HIGH (ref 0–25)
WBC, Synovial: 4500 /mm3 — ABNORMAL HIGH (ref 0–200)

## 2018-11-04 MED ORDER — LIDOCAINE HCL 2 % IJ SOLN
10.0000 mL | Freq: Once | INTRAMUSCULAR | Status: AC
  Administered 2018-11-04: 200 mg via INTRADERMAL
  Filled 2018-11-04: qty 20

## 2018-11-04 MED ORDER — COLCHICINE 0.6 MG PO TABS: 0.6000 mg | ORAL_TABLET | Freq: Every day | ORAL | 0 refills | Status: DC

## 2018-11-04 MED ORDER — COLCHICINE 0.6 MG PO TABS
1.2000 mg | ORAL_TABLET | Freq: Once | ORAL | Status: AC
  Administered 2018-11-04: 1.2 mg via ORAL
  Filled 2018-11-04: qty 2

## 2018-11-04 MED ORDER — HYDROCODONE-ACETAMINOPHEN 5-325 MG PO TABS
2.0000 | ORAL_TABLET | Freq: Once | ORAL | Status: AC
  Administered 2018-11-04: 2 via ORAL
  Filled 2018-11-04: qty 2

## 2018-11-04 MED ORDER — NAPROXEN 500 MG PO TABS: 500.0000 mg | ORAL_TABLET | Freq: Two times a day (BID) | ORAL | 0 refills | Status: DC

## 2018-11-04 NOTE — ED Notes (Signed)
Patient verbalizes understanding of discharge instructions. Opportunity for questioning and answers were provided. Armband removed by staff, pt discharged from ED.  

## 2018-11-04 NOTE — ED Provider Notes (Signed)
[facility] EMERGENCY DEPARTMENT Provider Note   CSN: [id number] Arrival date & time: 11/04/18  0944     History   Chief Complaint Chief Complaint  Patient presents with   Knee Pain    HPI Keith Deleon is a 63 y.o. male.     Patient is a 63 year old gentleman with past medical history of arthritis, hepatitis C, osteoarthritis of the knee presenting to the emergency department for right knee pain and swelling.  Patient reports that for the last 2 or 3 days he has had atraumatic knee pain and swelling.  Reports history of ligament repair in his knee before.  Also reports having to have fluid drained from his knee before as well.  Denies any injury or trauma, fever, chills, redness or warmth.  Has tried over-the-counter medications without relief.  Reports difficulty with ambulation due to the pain.     Past Medical History:  Diagnosis Date   Arthritis    Hepatitis C    Knee pain    Osteoarthritis of right shoulder 10/31/2013   Tuberculosis 88   tb rx    Patient Active Problem List   Diagnosis Date Noted   Osteoarthritis of right shoulder 10/31/2013   Primary osteoarthritis of right shoulder 10/31/2013    Past Surgical History:  Procedure Laterality Date   right knee surgery     cartilage   TOTAL SHOULDER ARTHROPLASTY Right 10/31/2013   Procedure: RIGHT TOTAL SHOULDER ARTHROPLASTY;  Surgeon: Eulas Post, MD;  Location: [facility] OR;  Service: Orthopedics;  Laterality: Right;  Right shoulder Hemi-arthroplasty        Home Medications    Prior to Admission medications   Medication Sig Start Date End Date Taking? Authorizing Provider  colchicine 0.6 MG tablet Take 1 tablet (0.6 mg total) by mouth daily for 1 day. 11/04/18 11/05/18  Arlyn Dunning, PA-C  HYDROcodone-acetaminophen (NORCO/VICODIN) 5-325 MG tablet Take 1 tablet by mouth every 6 (six) hours as needed for severe pain. Patient not taking: Reported on 05/23/2018 11/27/16   Zadie Rhine, MD  levofloxacin (LEVAQUIN) 500 MG tablet Take 1 tablet (500 mg total) by mouth daily. 11/01/18   Raeford Razor, MD  naproxen (NAPROSYN) 500 MG tablet Take 1 tablet (500 mg total) by mouth 2 (two) times daily. 11/04/18   Arlyn Dunning, PA-C  oxyCODONE (ROXICODONE) 5 MG immediate release tablet Take 1 tablet (5 mg total) by mouth every 4 (four) hours as needed for severe pain. Patient not taking: Reported on 05/23/2018 03/08/18   Robinson, [place] N, PA-C    Family History No family history on file.  Social History Social History   Tobacco Use   Smoking status: Current Every Day Smoker    Packs/day: 0.25    Years: 25.00    Pack years: 6.25    Types: Cigarettes   Smokeless tobacco: Never Used  Substance Use Topics   Alcohol use: Yes    Comment: 2-  40oz per day beer   Drug use: Yes    Types: Marijuana    Comment: last  time 10/17/13     Allergies   Chlorhexidine gluconate and Penicillins   Review of Systems Review of Systems  Constitutional: Negative for chills and fever.  Gastrointestinal: Negative for nausea and vomiting.  Musculoskeletal: Positive for arthralgias, gait problem and joint swelling. Negative for back pain, myalgias, neck pain and neck stiffness.  Skin: Negative for color change, rash and wound.  Neurological: Negative for dizziness.  All  other systems reviewed and are negative.    Physical Exam Updated Vital Signs BP (!) 169/93 (BP Location: Right Arm)    Pulse 69    Temp 98.4 F (36.9 C) (Oral)    Resp 17    Ht 5\' 8"  (1.727 m)    Wt 77.1 kg    SpO2 98%    BMI 25.85 kg/m   Physical Exam Vitals signs and nursing note reviewed.  Constitutional:      Appearance: Normal appearance.  HENT:     Head: Normocephalic.     Mouth/Throat:     Pharynx: Oropharynx is clear.  Eyes:     Conjunctiva/sclera: Conjunctivae normal.  Pulmonary:     Effort: Pulmonary effort is normal.  Musculoskeletal:     Comments: Anterior knee with gross swelling.   Increased warmth.  No signs of injury or trauma or wound.  No redness.  Normal distal pulses.  Skin:    General: Skin is dry.  Neurological:     Mental Status: He is alert.     Sensory: No sensory deficit.     Motor: No weakness.  Psychiatric:        Mood and Affect: Mood normal.      ED Treatments / Results  Labs (all labs ordered are listed, but only abnormal results are displayed) Labs Reviewed  SYNOVIAL CELL COUNT + DIFF, W/ CRYSTALS - Abnormal; Notable for the following components:      Result Value   Color, Synovial STRAW (*)    Appearance-Synovial CLOUDY (*)    WBC, Synovial 4,500 (*)    Neutrophil, Synovial 62 (*)    Lymphocytes-Synovial Fld 38 (*)    Monocyte-Macrophage-Synovial Fluid 0 (*)    All other components within normal limits  CBG MONITORING, ED - Abnormal; Notable for the following components:   Glucose-Capillary 115 (*)    All other components within normal limits  BODY FLUID CULTURE  GLUCOSE, BODY FLUID OTHER    EKG None  Radiology Dg Knee Complete 4 Views Right  Result Date: 11/04/2018 CLINICAL DATA:  Worsening knee pain. EXAM: RIGHT KNEE - COMPLETE 4+ VIEW COMPARISON:  Radiographs 10/21/2015 FINDINGS: Severe tricompartmental degenerative changes with joint space narrowing, osteophytic spurring, bony eburnation and subchondral cystic change. No obvious fracture or osteochondral lesion. 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 There is a moderate-sized joint effusion noted. IMPRESSION: 1. Severe tricompartmental degenerative changes, not significantly changed since 2017. 2. No acute fracture or osteochondral lesion. 3. 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 4. Moderate-sized joint effusion. Electronically Signed   By: Marijo Sanes M.D.   On: 11/04/2018 11:16    Procedures .Joint Aspiration/Arthrocentesis  Date/Time: 11/04/2018 2:22 PM Performed by: Alveria Apley, PA-C Authorized  by: Alveria Apley, PA-C   Consent:    Consent obtained:  Verbal   Consent given by:  Patient   Risks discussed:  Bleeding, incomplete drainage, infection, nerve damage and pain   Alternatives discussed:  No treatment, delayed treatment, alternative treatment and referral Location:    Location:  Knee   Knee:  R knee Anesthesia (see MAR for exact dosages):    Anesthesia method:  Local infiltration   Local anesthetic:  Lidocaine 2% w/o epi Procedure details:    Preparation: Patient was prepped and draped in usual sterile fashion     Needle gauge:  18 G   Ultrasound guidance: yes     Approach:  Lateral   Aspirate amount:  93cc   Aspirate characteristics:  Yellow   Steroid injected: no     Specimen collected: yes   Post-procedure details:    Dressing:  Adhesive bandage   Patient tolerance of procedure:  Tolerated well, no immediate complications   (including critical care time)  Medications Ordered in ED Medications  colchicine tablet 1.2 mg (has no administration in time range)  HYDROcodone-acetaminophen (NORCO/VICODIN) 5-325 MG per tablet 2 tablet (2 tablets Oral Given 11/04/18 1056)  lidocaine (XYLOCAINE) 2 % (with pres) injection 200 mg (200 mg Intradermal Given 11/04/18 1201)     Initial Impression / Assessment and Plan / ED Course  I have reviewed the triage vital signs and the nursing notes.  Pertinent labs & imaging results that were available during my care of the patient were reviewed by me and considered in my medical decision making (see chart for details).  Clinical Course as of Nov 03 1508  Fri Nov 04, 2018  1410 Crystals, Fluid: INTRACELLULAR CALCIUM PYROPHOSPHATE CRYSTALS [KM]  [id number] 63 year old male presenting with atraumatic right knee pain and swelling.  No fever.  X-ray showing no acute findings but significant arthritis.  The knee joint was aspirated by myself, see procedure note.  The synovial fluid analysis had a pattern suggesting pseudogout.  Has had a  history of the same in the past.  We will treat for this with colchicine for flareup.  1.2 mg here now and 2.6 mg additionally at 1 hour.  Then start naproxen tomorrow.  Renal function is normal per my review in his chart.  Advised on return precautions.   [KM]    Clinical Course User Index [KM] Arlyn DunningMcLean, Virtie Bungert A, PA-C       Based on review of vitals, medical screening exam, lab work and/or imaging, there does not appear to be an acute, emergent etiology for the patient's symptoms. Counseled pt on good return precautions and encouraged both PCP and ED follow-up as needed.  Prior to discharge, I also discussed incidental imaging findings with patient in detail and advised appropriate, recommended follow-up in detail.  Clinical Impression: 1. Pseudogout   2. Acute pain of right knee     Disposition: Discharge  Prior to providing a prescription for a controlled substance, I independently reviewed the patient's recent prescription history on the [place]  Controlled Substance Reporting System. The patient had no recent or regular prescriptions and was deemed appropriate for a brief, less than 3 day prescription of narcotic for acute analgesia.  This note was prepared with assistance of Conservation officer, historic buildingsDragon voice recognition software. Occasional wrong-word or sound-a-like substitutions may have occurred due to the inherent limitations of voice recognition software.   Final Clinical Impressions(s) / ED Diagnoses   Final diagnoses:  Pseudogout  Acute pain of right knee    ED Discharge Orders         Ordered    colchicine 0.6 MG tablet  Daily     11/04/18 1508    naproxen (NAPROSYN) 500 MG tablet  2 times daily     11/04/18 1508           Jeral PinchMcLean, Elmo Shumard A, PA-C 11/04/18 1510    Raeford RazorKohut, Stephen, MD 11/05/18 1217

## 2018-11-04 NOTE — ED Triage Notes (Signed)
Pt reports right knee pain with swelling noted that started yesterday. He states this happens every year and 'I usually have to get fluid pulled from it every time" Last done a year ago. Denies injury.

## 2018-11-04 NOTE — Discharge Instructions (Addendum)
Take the colchicine pill that was prescribed approximately 1 hour after the dose that was given to you here today.  You may start naproxen tomorrow for additional pain if you need it.  Continue to apply ice to your knee 3 times a day, 20 minutes at a time and elevate your knee when not in use.  Thank you for allowing me to care for you today. Please return to the emergency department if you have new or worsening symptoms. Take your medications as instructed.

## 2018-11-06 LAB — GLUCOSE, BODY FLUID OTHER: Glucose, Body Fluid Other: 69 mg/dL

## 2018-11-06 LAB — CULTURE, BLOOD (ROUTINE X 2)
Culture: NO GROWTH
Culture: NO GROWTH
Special Requests: ADEQUATE
Special Requests: ADEQUATE

## 2018-11-07 LAB — BODY FLUID CULTURE
Culture: NO GROWTH
Gram Stain: NONE SEEN

## 2018-11-19 ENCOUNTER — Encounter (HOSPITAL_COMMUNITY): Payer: Self-pay | Admitting: Emergency Medicine

## 2018-11-19 ENCOUNTER — Emergency Department (HOSPITAL_COMMUNITY)
Admission: EM | Admit: 2018-11-19 | Discharge: 2018-11-19 | Disposition: A | Discharge status: Home / Self Care | Payer: Medicaid Other | Attending: Emergency Medicine | Admitting: Emergency Medicine

## 2018-11-19 ENCOUNTER — Emergency Department (HOSPITAL_COMMUNITY): Payer: Medicaid Other

## 2018-11-19 ENCOUNTER — Other Ambulatory Visit: Payer: Self-pay

## 2018-11-19 DIAGNOSIS — M791 Myalgia, unspecified site: Secondary | ICD-10-CM | POA: Diagnosis not present

## 2018-11-19 DIAGNOSIS — F17210 Nicotine dependence, cigarettes, uncomplicated: Secondary | ICD-10-CM | POA: Insufficient documentation

## 2018-11-19 DIAGNOSIS — Z20828 Contact with and (suspected) exposure to other viral communicable diseases: Secondary | ICD-10-CM | POA: Insufficient documentation

## 2018-11-19 DIAGNOSIS — Z79899 Other long term (current) drug therapy: Secondary | ICD-10-CM | POA: Diagnosis not present

## 2018-11-19 DIAGNOSIS — R52 Pain, unspecified: Secondary | ICD-10-CM

## 2018-11-19 DIAGNOSIS — M542 Cervicalgia: Secondary | ICD-10-CM | POA: Diagnosis present

## 2018-11-19 LAB — CBC WITH DIFFERENTIAL/PLATELET
Abs Immature Granulocytes: 0.04 10*3/uL (ref 0.00–0.07)
Basophils Absolute: 0 10*3/uL (ref 0.0–0.1)
Basophils Relative: 1 %
Eosinophils Absolute: 0.1 10*3/uL (ref 0.0–0.5)
Eosinophils Relative: 1 %
HCT: 44.8 % (ref 39.0–52.0)
Hemoglobin: 14.5 g/dL (ref 13.0–17.0)
Immature Granulocytes: 1 %
Lymphocytes Relative: 19 %
Lymphs Abs: 1.1 10*3/uL (ref 0.7–4.0)
MCH: 27.4 pg (ref 26.0–34.0)
MCHC: 32.4 g/dL (ref 30.0–36.0)
MCV: 84.5 fL (ref 80.0–100.0)
Monocytes Absolute: 0.8 10*3/uL (ref 0.1–1.0)
Monocytes Relative: 15 %
Neutro Abs: 3.6 10*3/uL (ref 1.7–7.7)
Neutrophils Relative %: 63 %
Platelets: 276 10*3/uL (ref 150–400)
RBC: 5.3 MIL/uL (ref 4.22–5.81)
RDW: 14.3 % (ref 11.5–15.5)
WBC: 5.7 10*3/uL (ref 4.0–10.5)
nRBC: 0 % (ref 0.0–0.2)

## 2018-11-19 LAB — URINALYSIS, ROUTINE W REFLEX MICROSCOPIC
Bilirubin Urine: NEGATIVE
Glucose, UA: NEGATIVE mg/dL
Hgb urine dipstick: NEGATIVE
Ketones, ur: NEGATIVE mg/dL
Leukocytes,Ua: NEGATIVE
Nitrite: NEGATIVE
Protein, ur: NEGATIVE mg/dL
Specific Gravity, Urine: 1.012 (ref 1.005–1.030)
pH: 7 (ref 5.0–8.0)

## 2018-11-19 LAB — COMPREHENSIVE METABOLIC PANEL
ALT: 76 U/L — ABNORMAL HIGH (ref 0–44)
AST: 73 U/L — ABNORMAL HIGH (ref 15–41)
Albumin: 3.1 g/dL — ABNORMAL LOW (ref 3.5–5.0)
Alkaline Phosphatase: 54 U/L (ref 38–126)
Anion gap: 9 (ref 5–15)
BUN: 14 mg/dL (ref 8–23)
CO2: 28 mmol/L (ref 22–32)
Calcium: 8.8 mg/dL — ABNORMAL LOW (ref 8.9–10.3)
Chloride: 102 mmol/L (ref 98–111)
Creatinine, Ser: 0.95 mg/dL (ref 0.61–1.24)
GFR calc Af Amer: >60 mL/min (ref >60)
GFR calc non Af Amer: >60 mL/min (ref >60)
Glucose, Bld: 94 mg/dL (ref 70–99)
Potassium: 4.6 mmol/L (ref 3.5–5.1)
Sodium: 139 mmol/L (ref 135–145)
Total Bilirubin: 0.7 mg/dL (ref 0.3–1.2)
Total Protein: 6.8 g/dL (ref 6.5–8.1)

## 2018-11-19 LAB — SARS CORONAVIRUS 2 (TAT 6-24 HRS): SARS Coronavirus 2: NEGATIVE

## 2018-11-19 LAB — CK: Total CK: 95 U/L (ref 49–397)

## 2018-11-19 MED ORDER — DIAZEPAM 5 MG PO TABS
10.0000 mg | ORAL_TABLET | Freq: Once | ORAL | Status: AC
  Administered 2018-11-19: 10 mg via ORAL
  Filled 2018-11-19: qty 2

## 2018-11-19 NOTE — ED Notes (Signed)
Patient ambulated with a limp to wheelchair
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
## 2018-11-19 NOTE — ED Notes (Signed)
URINE CULTURE SENT WITH URINE 

## 2018-11-19 NOTE — ED Triage Notes (Signed)
Pt to triage via [facility].  C/o "spams" to neck, lower back, and R groin since yesterday.
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
## 2018-12-24 ENCOUNTER — Emergency Department (HOSPITAL_COMMUNITY)
Admission: EM | Admit: 2018-12-24 | Discharge: 2018-12-24 | Disposition: A | Discharge status: Home / Self Care | Payer: Medicaid Other | Attending: Emergency Medicine | Admitting: Emergency Medicine

## 2018-12-24 ENCOUNTER — Encounter (HOSPITAL_COMMUNITY): Payer: Self-pay | Admitting: *Deleted

## 2018-12-24 ENCOUNTER — Other Ambulatory Visit: Payer: Self-pay

## 2018-12-24 ENCOUNTER — Emergency Department (HOSPITAL_COMMUNITY): Payer: Medicaid Other

## 2018-12-24 DIAGNOSIS — F17210 Nicotine dependence, cigarettes, uncomplicated: Secondary | ICD-10-CM | POA: Insufficient documentation

## 2018-12-24 DIAGNOSIS — Z79899 Other long term (current) drug therapy: Secondary | ICD-10-CM | POA: Diagnosis not present

## 2018-12-24 DIAGNOSIS — M25562 Pain in left knee: Secondary | ICD-10-CM | POA: Insufficient documentation

## 2018-12-24 DIAGNOSIS — M25561 Pain in right knee: Secondary | ICD-10-CM | POA: Diagnosis present

## 2018-12-24 DIAGNOSIS — Z96611 Presence of right artificial shoulder joint: Secondary | ICD-10-CM | POA: Insufficient documentation

## 2018-12-24 HISTORY — DX: Gout, unspecified: M10.9

## 2018-12-24 LAB — COMPREHENSIVE METABOLIC PANEL
ALT: 52 U/L — ABNORMAL HIGH (ref 0–44)
AST: 53 U/L — ABNORMAL HIGH (ref 15–41)
Albumin: 2.9 g/dL — ABNORMAL LOW (ref 3.5–5.0)
Alkaline Phosphatase: 64 U/L (ref 38–126)
Anion gap: 11 (ref 5–15)
BUN: 11 mg/dL (ref 8–23)
CO2: 23 mmol/L (ref 22–32)
Calcium: 8.9 mg/dL (ref 8.9–10.3)
Chloride: 99 mmol/L (ref 98–111)
Creatinine, Ser: 0.75 mg/dL (ref 0.61–1.24)
GFR calc Af Amer: >60 mL/min (ref >60)
GFR calc non Af Amer: >60 mL/min (ref >60)
Glucose, Bld: 144 mg/dL — ABNORMAL HIGH (ref 70–99)
Potassium: 4 mmol/L (ref 3.5–5.1)
Sodium: 133 mmol/L — ABNORMAL LOW (ref 135–145)
Total Bilirubin: 1.1 mg/dL (ref 0.3–1.2)
Total Protein: 7.5 g/dL (ref 6.5–8.1)

## 2018-12-24 LAB — CBC WITH DIFFERENTIAL/PLATELET
Abs Immature Granulocytes: 0.13 10*3/uL — ABNORMAL HIGH (ref 0.00–0.07)
Basophils Absolute: 0 10*3/uL (ref 0.0–0.1)
Basophils Relative: 0 %
Eosinophils Absolute: 0 10*3/uL (ref 0.0–0.5)
Eosinophils Relative: 0 %
HCT: 43 % (ref 39.0–52.0)
Hemoglobin: 14.1 g/dL (ref 13.0–17.0)
Immature Granulocytes: 1 %
Lymphocytes Relative: 12 %
Lymphs Abs: 1.1 10*3/uL (ref 0.7–4.0)
MCH: 26.4 pg (ref 26.0–34.0)
MCHC: 32.8 g/dL (ref 30.0–36.0)
MCV: 80.5 fL (ref 80.0–100.0)
Monocytes Absolute: 1 10*3/uL (ref 0.1–1.0)
Monocytes Relative: 11 %
Neutro Abs: 6.9 10*3/uL (ref 1.7–7.7)
Neutrophils Relative %: 76 %
Platelets: 410 10*3/uL — ABNORMAL HIGH (ref 150–400)
RBC: 5.34 MIL/uL (ref 4.22–5.81)
RDW: 14.9 % (ref 11.5–15.5)
WBC: 9.2 10*3/uL (ref 4.0–10.5)
nRBC: 0 % (ref 0.0–0.2)

## 2018-12-24 MED ORDER — OXYCODONE HCL 5 MG PO TABS
10.0000 mg | ORAL_TABLET | Freq: Once | ORAL | Status: AC
  Administered 2018-12-24: 11:00:00 10 mg via ORAL
  Filled 2018-12-24: qty 2

## 2018-12-24 MED ORDER — ACETAMINOPHEN 325 MG PO TABS
650.0000 mg | ORAL_TABLET | Freq: Once | ORAL | Status: AC
  Administered 2018-12-24: 11:00:00 650 mg via ORAL
  Filled 2018-12-24: qty 2

## 2018-12-24 MED ORDER — COLCHICINE 0.6 MG PO TABS
1.2000 mg | ORAL_TABLET | Freq: Once | ORAL | Status: AC
  Administered 2018-12-24: 11:00:00 1.2 mg via ORAL
  Filled 2018-12-24: qty 2

## 2018-12-24 MED ORDER — KETOROLAC TROMETHAMINE 15 MG/ML IJ SOLN
15.0000 mg | Freq: Once | INTRAMUSCULAR | Status: AC
  Administered 2018-12-24: 15 mg via INTRAVENOUS
  Filled 2018-12-24: qty 1

## 2018-12-24 MED ORDER — INDOMETHACIN 50 MG PO CAPS: 50.0000 mg | ORAL_CAPSULE | Freq: Two times a day (BID) | ORAL | 0 refills | Status: DC

## 2018-12-24 MED ORDER — COLCHICINE 0.6 MG PO TABS
0.6000 mg | ORAL_TABLET | Freq: Once | ORAL | Status: AC
  Administered 2018-12-24: 12:00:00 0.6 mg via ORAL
  Filled 2018-12-24: qty 1

## 2018-12-24 MED ORDER — OXYCODONE-ACETAMINOPHEN 5-325 MG PO TABS
1.0000 | ORAL_TABLET | ORAL | Status: DC | PRN
  Administered 2018-12-24: 09:00:00 1 via ORAL
  Filled 2018-12-24: qty 1

## 2018-12-24 MED ORDER — SODIUM CHLORIDE 0.9% FLUSH: 3.0000 mL | Freq: Once | INTRAVENOUS | Status: DC

## 2018-12-24 NOTE — Discharge Instructions (Signed)
  Also take tylenol 1000mg(2 extra strength) four times a day.   Then take the pain medicine if you feel like you need it. Narcotics do not help with the pain, they only make you care about it less.  You can become addicted to this, people may break into your house to steal it.  It will constipate you.  If you drive under the influence of this medicine you can get a DUI.    

## 2018-12-24 NOTE — ED Triage Notes (Addendum)
Pt here from home via [facility] for bil knee pain x 3 days.  Hx of gout.  R knee swollen up the size of a baseball, warm to touch.  Pt states last time he was here his knee was drained.  Also states feet are numb and cold

## 2018-12-24 NOTE — ED Notes (Signed)
Pt to xray

## 2018-12-24 NOTE — ED Provider Notes (Signed)
[facility] EMERGENCY DEPARTMENT Provider Note   CSN: [id number] Arrival date & time: 12/24/18  6761     History   Chief Complaint Chief Complaint  Patient presents with  . Knee Pain    HPI Keith Deleon is a 63 y.o. male.     63 yo M with a chief complaint of bilateral knee pain.  Going on for the past few days.  Has a history of gout and feels like the same in the right knee.  Has a different pain in the left knee.  Describes it to the posterior aspects along the medial knee.  Worse with ambulation and palpation.  No fevers no trauma.  No numbness or tingling.  The history is provided by the patient.  Knee Pain Location:  Knee Time since incident:  3 days Injury: no   Knee location:  R knee and L knee Pain details:    Quality:  Aching   Radiates to:  Does not radiate   Severity:  Moderate   Onset quality:  Gradual   Duration:  3 days   Timing:  Constant   Progression:  Worsening Chronicity:  Recurrent Dislocation: no   Prior injury to area:  No Relieved by:  Nothing Worsened by:  Bearing weight, activity, flexion, extension and rotation Ineffective treatments:  None tried Associated symptoms: no fever     Past Medical History:  Diagnosis Date  . Arthritis   . Gout   . Hepatitis C   . Knee pain   . Osteoarthritis of right shoulder 10/31/2013  . Tuberculosis 56   tb rx    Patient Active Problem List   Diagnosis Date Noted  . Osteoarthritis of right shoulder 10/31/2013  . Primary osteoarthritis of right shoulder 10/31/2013    Past Surgical History:  Procedure Laterality Date  . right knee surgery     cartilage  . TOTAL SHOULDER ARTHROPLASTY Right 10/31/2013   Procedure: RIGHT TOTAL SHOULDER ARTHROPLASTY;  Surgeon: Johnny Bridge, MD;  Location: [facility];  Service: Orthopedics;  Laterality: Right;  Right shoulder Hemi-arthroplasty        Home Medications    Prior to Admission medications   Medication Sig Start Date End Date  Taking? Authorizing Provider  colchicine 0.6 MG tablet Take 1 tablet (0.6 mg total) by mouth daily for 1 day. 11/04/18 11/05/18  Alveria Apley, PA-C  indomethacin (INDOCIN) 50 MG capsule Take 1 capsule (50 mg total) by mouth 2 (two) times daily with a meal. 12/24/18   Deno Etienne, DO  levofloxacin (LEVAQUIN) 500 MG tablet Take 1 tablet (500 mg total) by mouth daily. 11/01/18   Virgel Manifold, MD  naproxen (NAPROSYN) 500 MG tablet Take 1 tablet (500 mg total) by mouth 2 (two) times daily. 11/04/18   Alveria Apley, PA-C    Family History No family history on file.  Social History Social History   Tobacco Use  . Smoking status: Current Every Day Smoker    Packs/day: 0.25    Years: 25.00    Pack years: 6.25    Types: Cigarettes  . Smokeless tobacco: Never Used  Substance Use Topics  . Alcohol use: Yes    Comment: 2-  40oz per day beer  . Drug use: Yes    Types: Marijuana     Allergies   Chlorhexidine gluconate and Penicillins   Review of Systems Review of Systems  Constitutional: Negative for chills and fever.  HENT: Negative for congestion and facial  swelling.   Eyes: Negative for discharge and visual disturbance.  Respiratory: Negative for shortness of breath.   Cardiovascular: Negative for chest pain and palpitations.  Gastrointestinal: Negative for abdominal pain, diarrhea and vomiting.  Musculoskeletal: Positive for arthralgias and gait problem. Negative for myalgias.  Skin: Negative for color change and rash.  Neurological: Negative for tremors, syncope and headaches.  Psychiatric/Behavioral: Negative for confusion and dysphoric mood.     Physical Exam Updated Vital Signs BP (!) 200/116 (BP Location: Right Arm)   Pulse 78   Temp 98.6 F (37 C) (Oral)   Resp 20   Ht 5\' 8"  (1.727 m)   Wt 74.8 kg   SpO2 100%   BMI 25.09 kg/m   Physical Exam Vitals signs and nursing note reviewed.  Constitutional:      Appearance: He is well-developed.  HENT:     Head:  Normocephalic and atraumatic.  Eyes:     Pupils: Pupils are equal, round, and reactive to light.  Neck:     Musculoskeletal: Normal range of motion and neck supple.     Vascular: No JVD.  Cardiovascular:     Rate and Rhythm: Normal rate and regular rhythm.     Heart sounds: No murmur. No friction rub. No gallop.   Pulmonary:     Effort: No respiratory distress.     Breath sounds: No wheezing.  Abdominal:     General: There is no distension.     Tenderness: There is no guarding or rebound.  Musculoskeletal: Normal range of motion.        General: Swelling and tenderness present.     Comments: For large effusion to the right knee.  Not exceptionally warm.  No appreciable erythema. able to range with moderate tenderness.  Left knee has pain at the attachment of the hamstring to the posterior tibia.  Reproduces the patient's pain.  Full range of motion.  No appreciable edema or erythema.  Skin:    Coloration: Skin is not pale.     Findings: No rash.  Neurological:     Mental Status: He is alert and oriented to person, place, and time.  Psychiatric:        Behavior: Behavior normal.      ED Treatments / Results  Labs (all labs ordered are listed, but only abnormal results are displayed) Labs Reviewed  COMPREHENSIVE METABOLIC PANEL - Abnormal; Notable for the following components:      Result Value   Sodium 133 (*)    Glucose, Bld 144 (*)    Albumin 2.9 (*)    AST 53 (*)    ALT 52 (*)    All other components within normal limits  CBC WITH DIFFERENTIAL/PLATELET - Abnormal; Notable for the following components:   Platelets 410 (*)    Abs Immature Granulocytes 0.13 (*)    All other components within normal limits    EKG None  Radiology Dg Knee Complete 4 Views Left  Result Date: 12/24/2018 CLINICAL DATA:  Pt here for bil knee pain x 3 days. Hx of gout. R knee swollen up the size of a baseball, warm to touch. Pt states last time he was here, his knee was drained. Also  states feet are numb and cold. Denies recent injury. EXAM: LEFT KNEE - COMPLETE 4+ VIEW COMPARISON:  None. FINDINGS: No fracture or dislocation. No effusion. Normal alignment. Metallic BB projects in or on the posteromedial soft tissues at the level of the distal femoral shaft. Chondrocalcinosis in  medial and lateral compartments. Small marginal spurs about all 3 compartments of the knee. IMPRESSION: 1. Negative for fracture or other acute bone abnormality. 2. 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 Electronically Signed   By: Corlis Leak M.D.   On: 12/24/2018 09:50   Dg Knee Complete 4 Views Right  Result Date: 12/24/2018 CLINICAL DATA:  Pt here for bil knee pain x 3 days. Hx of gout. R knee swollen up the size of a baseball, warm to touch. Pt states last time he was here, his knee was drained. Also states feet are numb and cold. Denies recent injury. EXAM: RIGHT KNEE - COMPLETE 4+ VIEW COMPARISON:  11/04/2018 FINDINGS: No fracture. No dislocation. Large effusion with probable osteochondral bodies in the suprapatellar bursa. Advanced loss of articular cartilage in the lateral and medial compartments. Subchondral cystic changes in femoral condyles and lateral tibial plateau. Marginal spurring about all 3 compartments of the knee. IMPRESSION: 1. Negative for fracture or other acute bone abnormality. 2. Tricompartmental DJD with large effusion and probable free osteochondral bodies. Electronically Signed   By: Corlis Leak M.D.   On: 12/24/2018 09:48    Procedures Procedures (including critical care time)  Medications Ordered in ED Medications  oxyCODONE-acetaminophen (PERCOCET/ROXICET) 5-325 MG per tablet 1 tablet (1 tablet Oral Given 12/24/18 0916)  sodium chloride flush (NS) 0.9 % injection 3 mL (3 mLs Intravenous Not Given 12/24/18 1014)  colchicine tablet 0.6 mg (has no administration in time range)  colchicine tablet 1.2 mg (1.2 mg Oral Given 12/24/18 1048)   ketorolac (TORADOL) 15 MG/ML injection 15 mg (15 mg Intravenous Given 12/24/18 1057)  acetaminophen (TYLENOL) tablet 650 mg (650 mg Oral Given 12/24/18 1048)  oxyCODONE (Oxy IR/ROXICODONE) immediate release tablet 10 mg (10 mg Oral Given 12/24/18 1048)     Initial Impression / Assessment and Plan / ED Course  I have reviewed the triage vital signs and the nursing notes.  Pertinent labs & imaging results that were available during my care of the patient were reviewed by me and considered in my medical decision making (see chart for details).        63 yo M with a chief complaints of bilateral knee pain.  Patient's been having pain in his right knee that is consistent of his prior pseudogout episodes.  Feels that it feels the same.  Has had an aspiration that confirmed the diagnosis.  I discussed with him risks and benefits of repeat aspiration.  At this point we will hold off as he has had similar knee pain and had a similar presentation.  Seems unlikely to be septic arthritis based on his presentation and exam.  He had lab work done in triage and the white blood cell count is also normal.  Patient's pain on his left knee is likely a strain.  Plain film without fracture as viewed by me bilaterally.  Given colchicine here.  Started on indomethacin.  PCP follow-up.  11:27 AM:  I have discussed the diagnosis/risks/treatment options with the patient and believe the pt to be eligible for discharge home to follow-up with PCP. We also discussed returning to the ED immediately if new or worsening sx occur. We discussed the sx which are most concerning (e.g., sudden worsening pain, fever, inability to tolerate by mouth) that necessitate immediate return. Medications administered to the patient during their visit and any new prescriptions provided to the patient are listed below.  Medications given during this visit Medications  oxyCODONE-acetaminophen (PERCOCET/ROXICET) 5-325 MG  per tablet 1 tablet (1  tablet Oral Given 12/24/18 0916)  sodium chloride flush (NS) 0.9 % injection 3 mL (3 mLs Intravenous Not Given 12/24/18 1014)  colchicine tablet 0.6 mg (has no administration in time range)  colchicine tablet 1.2 mg (1.2 mg Oral Given 12/24/18 1048)  ketorolac (TORADOL) 15 MG/ML injection 15 mg (15 mg Intravenous Given 12/24/18 1057)  acetaminophen (TYLENOL) tablet 650 mg (650 mg Oral Given 12/24/18 1048)  oxyCODONE (Oxy IR/ROXICODONE) immediate release tablet 10 mg (10 mg Oral Given 12/24/18 1048)     The patient appears reasonably screen and/or stabilized for discharge and I doubt any other medical condition or other [facility] requiring further screening, evaluation, or treatment in the ED at this time prior to discharge.    Final Clinical Impressions(s) / ED Diagnoses   Final diagnoses:  Acute pain of both knees    ED Discharge Orders         Ordered    indomethacin (INDOCIN) 50 MG capsule  2 times daily with meals     12/24/18 1104           Melene Plan, DO 12/24/18 1127

## 2019-03-13 ENCOUNTER — Encounter (HOSPITAL_COMMUNITY): Payer: Self-pay | Admitting: Emergency Medicine

## 2019-03-13 ENCOUNTER — Emergency Department (HOSPITAL_COMMUNITY)
Admission: EM | Admit: 2019-03-13 | Discharge: 2019-03-13 | Disposition: A | Discharge status: Home / Self Care | Payer: Medicaid Other | Attending: Emergency Medicine | Admitting: Emergency Medicine

## 2019-03-13 DIAGNOSIS — Z79899 Other long term (current) drug therapy: Secondary | ICD-10-CM | POA: Insufficient documentation

## 2019-03-13 DIAGNOSIS — F17210 Nicotine dependence, cigarettes, uncomplicated: Secondary | ICD-10-CM | POA: Insufficient documentation

## 2019-03-13 DIAGNOSIS — M1120 Other chondrocalcinosis, unspecified site: Secondary | ICD-10-CM | POA: Insufficient documentation

## 2019-03-13 DIAGNOSIS — Z96611 Presence of right artificial shoulder joint: Secondary | ICD-10-CM | POA: Diagnosis not present

## 2019-03-13 DIAGNOSIS — M25562 Pain in left knee: Secondary | ICD-10-CM | POA: Diagnosis present

## 2019-03-13 MED ORDER — OXYCODONE HCL 5 MG PO TABS
10.0000 mg | ORAL_TABLET | Freq: Once | ORAL | Status: AC
  Administered 2019-03-13: 10 mg via ORAL
  Filled 2019-03-13: qty 2

## 2019-03-13 MED ORDER — COLCHICINE 0.6 MG PO TABS: ORAL_TABLET | ORAL | 1 refills | Status: DC

## 2019-03-13 MED ORDER — INDOMETHACIN 50 MG PO CAPS: 50.0000 mg | ORAL_CAPSULE | Freq: Two times a day (BID) | ORAL | 1 refills | Status: DC

## 2019-03-13 MED ORDER — KETOROLAC TROMETHAMINE 15 MG/ML IJ SOLN
15.0000 mg | Freq: Once | INTRAMUSCULAR | Status: AC
  Administered 2019-03-13: 15 mg via INTRAMUSCULAR
  Filled 2019-03-13: qty 1

## 2019-03-13 MED ORDER — COLCHICINE 0.6 MG PO TABS
1.2000 mg | ORAL_TABLET | Freq: Once | ORAL | Status: AC
  Administered 2019-03-13: 1.2 mg via ORAL
  Filled 2019-03-13: qty 2

## 2019-03-13 NOTE — Discharge Instructions (Signed)
Please read and follow all provided instructions.  Your diagnoses today include:  1. Pseudogout     Tests performed today include:  Vital signs. See below for your results today.   Medications prescribed:   Colchicine-medication for gout flare, do not take more than prescribed   Indomethacin -anti-inflammatory medication for gout flare, take with food  Take any prescribed medications only as directed.  Home care instructions:   Follow any educational materials contained in this packet  Follow R.I.C.E. Protocol:  R - rest your injury   I  - use ice on injury without applying directly to skin  C - compress injury with bandage or splint  E - elevate the injury as much as possible  Follow-up instructions: Please follow-up with your primary care provider if you continue to have significant pain in 1 week. In this case you may have a more severe injury that requires further care.   Return instructions:   Please return if your toes or feet are numb or tingling, appear gray or blue, or you have severe pain (also elevate the leg and loosen splint or wrap if you were given one)  Please return to the Emergency Department if you experience worsening symptoms.   Please return if you have any other emergent concerns.  Additional Information:  Your vital signs today were: BP (!) 179/110 (BP Location: Right Arm)   Pulse 84   Temp 98.6 F (37 C) (Oral)   Resp 16   SpO2 100%  If your blood pressure (BP) was elevated above 135/85 this visit, please have this repeated by your doctor within one month. --------------

## 2019-03-13 NOTE — ED Notes (Signed)
Patient verbalizes understanding of discharge instructions . Opportunity for questions and answers were provided . Armband removed by staff ,Pt discharged from ED. W/C  offered at D/C  and Declined W/C at D/C and was escorted to lobby by RN.  

## 2019-03-13 NOTE — ED Triage Notes (Signed)
Pt arrive via [facility] with chronic gout with swelling in both knee and bilateral feet. Pt has ran out of gout medication pt here today due to pain so bad last night unable to ambulate. Ems reports pt can stand and pivot.

## 2019-03-13 NOTE — ED Provider Notes (Signed)
[facility] EMERGENCY DEPARTMENT Provider Note   CSN: [id number] Arrival date & time: 03/13/19  1004     History Chief Complaint  Patient presents with  . Knee Pain  . Gout    Keith Deleon is a 64 y.o. male.  Patient with history of pseudogout (last seen on synovial fluid 4 months ago) --presents the emergency department with worsening bilateral knee pain, left greater than right, with associated swelling.  Patient is out of medications which he takes for his pseudogout.  He also complains of pain in his bilateral heels.  Pain in the left leg radiates up to the groin.  There is no associated swelling or redness of the skin in that area.  Patient denies any fevers, chest pain or shortness of breath.  States that he does not currently have a primary care doctor.  Patient was last seen for this in the emergency department 3 months ago.  He was started on indomethacin and colchicine at that time.         Past Medical History:  Diagnosis Date  . Arthritis   . Gout   . Hepatitis C   . Knee pain   . Osteoarthritis of right shoulder 10/31/2013  . Tuberculosis 88   tb rx    Patient Active Problem List   Diagnosis Date Noted  . Osteoarthritis of right shoulder 10/31/2013  . Primary osteoarthritis of right shoulder 10/31/2013    Past Surgical History:  Procedure Laterality Date  . right knee surgery     cartilage  . TOTAL SHOULDER ARTHROPLASTY Right 10/31/2013   Procedure: RIGHT TOTAL SHOULDER ARTHROPLASTY;  Surgeon: Eulas Post, MD;  Location: [facility];  Service: Orthopedics;  Laterality: Right;  Right shoulder Hemi-arthroplasty       No family history on file.  Social History   Tobacco Use  . Smoking status: Current Every Day Smoker    Packs/day: 0.25    Years: 25.00    Pack years: 6.25    Types: Cigarettes  . Smokeless tobacco: Never Used  Substance Use Topics  . Alcohol use: Yes    Comment: 2-  40oz per day beer  . Drug use: Yes    Types:  Marijuana    Home Medications Prior to Admission medications   Medication Sig Start Date End Date Taking? Authorizing Provider  colchicine 0.6 MG tablet Take 1 tablet (0.6 mg total) by mouth daily for 1 day. 11/04/18 11/05/18  Arlyn Dunning, PA-C  indomethacin (INDOCIN) 50 MG capsule Take 1 capsule (50 mg total) by mouth 2 (two) times daily with a meal. 12/24/18   Melene Plan, DO  levofloxacin (LEVAQUIN) 500 MG tablet Take 1 tablet (500 mg total) by mouth daily. 11/01/18   Raeford Razor, MD  naproxen (NAPROSYN) 500 MG tablet Take 1 tablet (500 mg total) by mouth 2 (two) times daily. 11/04/18   Arlyn Dunning, PA-C    Allergies    Chlorhexidine gluconate and Penicillins  Review of Systems   Review of Systems  Constitutional: Positive for activity change.  Respiratory: Negative for shortness of breath.   Cardiovascular: Negative for chest pain.  Musculoskeletal: Positive for arthralgias, gait problem and joint swelling. Negative for back pain and neck pain.  Skin: Negative for wound.  Neurological: Negative for weakness and numbness.    Physical Exam Updated Vital Signs BP (!) 179/110 (BP Location: Right Arm)   Pulse 84   Temp 98.6 F (37 C) (Oral)   Resp  16   SpO2 100%   Physical Exam Vitals and nursing note reviewed.  Constitutional:      Appearance: He is well-developed.  HENT:     Head: Normocephalic and atraumatic.     Right Ear: Tympanic membrane, ear canal and external ear normal.     Left Ear: Tympanic membrane, ear canal and external ear normal.  Eyes:     General:        Right eye: No discharge.        Left eye: No discharge.     Conjunctiva/sclera: Conjunctivae normal.  Cardiovascular:     Rate and Rhythm: Normal rate and regular rhythm.     Heart sounds: Normal heart sounds.  Pulmonary:     Effort: Pulmonary effort is normal.     Breath sounds: Normal breath sounds.  Abdominal:     Palpations: Abdomen is soft.     Tenderness: There is no abdominal  tenderness. There is no guarding or rebound.  Musculoskeletal:     Cervical back: Normal range of motion and neck supple.     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  Patient does have an effusion bilaterally, left greater than right.  Minimal warmth.  Ankles and feet appear normal.  Significant fungal infection of the nails noted.  No bulging, redness, inflammation in the left groin.  No palpable abscesses or hernias.  Patient with expected active range of motion in the ankles and hips.  Skin:    General: Skin is warm and dry.  Neurological:     Mental Status: He is alert.     ED Results / Procedures / Treatments   Labs (all labs ordered are listed, but only abnormal results are displayed) Labs Reviewed - No data to display  EKG None  Radiology No results found.  Procedures Procedures (including critical care time)  Medications Ordered in ED Medications  colchicine tablet 1.2 mg (1.2 mg Oral Given 03/13/19 1101)  ketorolac (TORADOL) 15 MG/ML injection 15 mg (15 mg Intramuscular Given 03/13/19 1058)  oxyCODONE (Oxy IR/ROXICODONE) immediate release tablet 10 mg (10 mg Oral Given 03/13/19 1101)    ED Course  I have reviewed the triage vital signs and the nursing notes.  Pertinent labs & imaging results that were available during my care of the patient were reviewed by me and considered in my medical decision making (see chart for details).  Patient seen and examined.  Will give colchicine, IM Toradol, oral oxycodone here.  Plan on discharge to home with colchicine and indomethacin.  Will give PCP referral to [facility].  Patient will need to have his blood pressure recheck.  Vital signs reviewed and are as follows: BP (!) 179/110 (BP Location: Right Arm)   Pulse 84   Temp 98.6 F (37 C) (Oral)   Resp 16   SpO2 100%   11:35 AM patient has received medications.  Continues to look comfortable.  We will  discharged home.  Counseled on typical course of pain and swelling.  Encourage PCP follow-up for chronic management and for blood pressure recheck.    MDM Rules/Calculators/A&P                      Patient with swelling and pain in the knees with radiation to other parts of his leg.  Consistent with previous episodes of calcium pyrophosphate deposition.  Patient will be treated for the same.  No focal pain or  swelling to suggest DVT, abscess, cellulitis.  Patient has some pain in the left groin however normal on exam without abscess or large hernia.  Strongly encouraged PCP follow-up as this is a recurrent problem for the patient.  Final Clinical Impression(s) / ED Diagnoses Final diagnoses:  Pseudogout    Rx / DC Orders ED Discharge Orders    None       Carlisle Cater, PA-C 03/13/19 1136    Malvin Johns, MD 03/13/19 1235

## 2019-04-28 ENCOUNTER — Emergency Department (HOSPITAL_COMMUNITY)
Admission: EM | Admit: 2019-04-28 | Discharge: 2019-04-28 | Disposition: A | Discharge status: Home / Self Care | Payer: Medicaid Other | Attending: Emergency Medicine | Admitting: Emergency Medicine

## 2019-04-28 DIAGNOSIS — M11242 Other chondrocalcinosis, left hand: Secondary | ICD-10-CM | POA: Diagnosis not present

## 2019-04-28 DIAGNOSIS — M25561 Pain in right knee: Secondary | ICD-10-CM | POA: Diagnosis present

## 2019-04-28 DIAGNOSIS — F17210 Nicotine dependence, cigarettes, uncomplicated: Secondary | ICD-10-CM | POA: Insufficient documentation

## 2019-04-28 DIAGNOSIS — M1120 Other chondrocalcinosis, unspecified site: Secondary | ICD-10-CM

## 2019-04-28 DIAGNOSIS — Z96611 Presence of right artificial shoulder joint: Secondary | ICD-10-CM | POA: Insufficient documentation

## 2019-04-28 DIAGNOSIS — M11232 Other chondrocalcinosis, left wrist: Secondary | ICD-10-CM | POA: Diagnosis not present

## 2019-04-28 LAB — CBC WITH DIFFERENTIAL/PLATELET
Abs Immature Granulocytes: 0.04 10*3/uL (ref 0.00–0.07)
Basophils Absolute: 0 10*3/uL (ref 0.0–0.1)
Basophils Relative: 1 %
Eosinophils Absolute: 0 10*3/uL (ref 0.0–0.5)
Eosinophils Relative: 0 %
HCT: 45.8 % (ref 39.0–52.0)
Hemoglobin: 14.6 g/dL (ref 13.0–17.0)
Immature Granulocytes: 1 %
Lymphocytes Relative: 19 %
Lymphs Abs: 1.1 10*3/uL (ref 0.7–4.0)
MCH: 24.8 pg — ABNORMAL LOW (ref 26.0–34.0)
MCHC: 31.9 g/dL (ref 30.0–36.0)
MCV: 77.8 fL — ABNORMAL LOW (ref 80.0–100.0)
Monocytes Absolute: 0.7 10*3/uL (ref 0.1–1.0)
Monocytes Relative: 12 %
Neutro Abs: 3.9 10*3/uL (ref 1.7–7.7)
Neutrophils Relative %: 67 %
Platelets: 343 10*3/uL (ref 150–400)
RBC: 5.89 MIL/uL — ABNORMAL HIGH (ref 4.22–5.81)
RDW: 17.9 % — ABNORMAL HIGH (ref 11.5–15.5)
WBC: 5.7 10*3/uL (ref 4.0–10.5)
nRBC: 0 % (ref 0.0–0.2)

## 2019-04-28 LAB — BASIC METABOLIC PANEL
Anion gap: 13 (ref 5–15)
BUN: 8 mg/dL (ref 8–23)
CO2: 23 mmol/L (ref 22–32)
Calcium: 9.1 mg/dL (ref 8.9–10.3)
Chloride: 97 mmol/L — ABNORMAL LOW (ref 98–111)
Creatinine, Ser: 0.68 mg/dL (ref 0.61–1.24)
GFR calc Af Amer: >60 mL/min (ref >60)
GFR calc non Af Amer: >60 mL/min (ref >60)
Glucose, Bld: 118 mg/dL — ABNORMAL HIGH (ref 70–99)
Potassium: 4.2 mmol/L (ref 3.5–5.1)
Sodium: 133 mmol/L — ABNORMAL LOW (ref 135–145)

## 2019-04-28 MED ORDER — COLCHICINE 0.6 MG PO TABS
1.2000 mg | ORAL_TABLET | Freq: Once | ORAL | Status: AC
  Administered 2019-04-28: 1.2 mg via ORAL
  Filled 2019-04-28: qty 2

## 2019-04-28 MED ORDER — COLCHICINE 0.6 MG PO TABS
0.6000 mg | ORAL_TABLET | Freq: Once | ORAL | Status: AC
  Administered 2019-04-28: 0.6 mg via ORAL
  Filled 2019-04-28: qty 1

## 2019-04-28 MED ORDER — OXYCODONE-ACETAMINOPHEN 5-325 MG PO TABS
1.0000 | ORAL_TABLET | Freq: Once | ORAL | Status: AC
  Administered 2019-04-28: 1 via ORAL
  Filled 2019-04-28: qty 1

## 2019-04-28 MED ORDER — COLCHICINE 0.6 MG PO TABS: 0.6000 mg | ORAL_TABLET | Freq: Two times a day (BID) | ORAL | 0 refills | Status: DC

## 2019-04-28 NOTE — ED Provider Notes (Signed)
[facility] EMERGENCY DEPARTMENT Provider Note   CSN: [id number] Arrival date & time: 04/28/19  1712     History Chief Complaint  Patient presents with  . Gout    Keith Deleon is a 64 y.o. male.  The history is provided by the patient and medical records. No language interpreter was used.   Keith Deleon is a 64 y.o. male who presents to the Emergency Department complaining of gout flare. He has a history of pseudo-gout and states that he is currently having a gout flare. A few days ago he developed some aching pain and bilateral knees, which is typical for his scalp. Last night he developed significant swelling and pain to his left hand. He has not had gout affect his hand previously. He denies any injuries to the hand. He is right-hand dominant. He denies any fevers, chest pain, shortness of breath, nausea, vomiting, diarrhea. He states that the pain is similar to prior gout flare's. He currently takes no medications. His last flare was about one month ago. He does drink alcohol and had a few sips of the 40 ounce beer earlier today.    Past Medical History:  Diagnosis Date  . Arthritis   . Gout   . Hepatitis C   . Knee pain   . Osteoarthritis of right shoulder 10/31/2013  . Tuberculosis 88   tb rx    Patient Active Problem List   Diagnosis Date Noted  . Osteoarthritis of right shoulder 10/31/2013  . Primary osteoarthritis of right shoulder 10/31/2013    Past Surgical History:  Procedure Laterality Date  . right knee surgery     cartilage  . TOTAL SHOULDER ARTHROPLASTY Right 10/31/2013   Procedure: RIGHT TOTAL SHOULDER ARTHROPLASTY;  Surgeon: Eulas Post, MD;  Location: [facility] OR;  Service: Orthopedics;  Laterality: Right;  Right shoulder Hemi-arthroplasty       No family history on file.  Social History   Tobacco Use  . Smoking status: Current Every Day Smoker    Packs/day: 0.25    Years: 25.00    Pack years: 6.25    Types: Cigarettes  .  Smokeless tobacco: Never Used  Substance Use Topics  . Alcohol use: Yes    Comment: 2-  40oz per day beer  . Drug use: Yes    Types: Marijuana    Home Medications Prior to Admission medications   Medication Sig Start Date End Date Taking? Authorizing Provider  acetaminophen (TYLENOL) 500 MG tablet Take 500-1,000 mg by mouth every 6 (six) hours as needed (for pain).   Yes [provider]  naproxen sodium (ALEVE) 220 MG tablet Take 220-440 mg by mouth 2 (two) times daily as needed (for pain).   Yes [provider]  colchicine 0.6 MG tablet Take 1 tablet (0.6 mg total) by mouth 2 (two) times daily. 04/28/19   Tilden Fossa, MD  indomethacin (INDOCIN) 50 MG capsule Take 1 capsule (50 mg total) by mouth 2 (two) times daily with a meal. Patient not taking: Reported on 04/28/2019 03/13/19   Renne Crigler, PA-C  levofloxacin (LEVAQUIN) 500 MG tablet Take 1 tablet (500 mg total) by mouth daily. Patient not taking: Reported on 04/28/2019 11/01/18   Raeford Razor, MD  naproxen (NAPROSYN) 500 MG tablet Take 1 tablet (500 mg total) by mouth 2 (two) times daily. Patient not taking: Reported on 04/28/2019 11/04/18   Ronnie Doss A, PA-C    Allergies    Chlorhexidine gluconate and Penicillins  Review of Systems   Review of Systems  All other systems reviewed and are negative.   Physical Exam Updated Vital Signs BP (!) 186/110   Pulse 83   Temp 97.7 F (36.5 C)   Resp 18   SpO2 100%   Physical Exam Vitals and nursing note reviewed.  Constitutional:      Appearance: He is well-developed.  HENT:     Head: Normocephalic and atraumatic.  Cardiovascular:     Rate and Rhythm: Normal rate and regular rhythm.     Heart sounds: No murmur.  Pulmonary:     Effort: Pulmonary effort is normal. No respiratory distress.     Breath sounds: Normal breath sounds.  Abdominal:     Palpations: Abdomen is soft.     Tenderness: There is no abdominal tenderness. There is no guarding or  rebound.  Musculoskeletal:     Comments: No significant tenderness to bilateral knees with no significant knee effusion. Able to range knees bilaterally. 2+ DP pulses. There is moderate to severe swelling and tenderness to the left dorsal wrist and left hand. Point of proximal tenderness is over the dorsal wrist with decreased range of motion. He is able to range the wrist. There is no significant erythema or edema to the palmar side of the hand and wrist. Range of motion is intact and the elbow.  Skin:    General: Skin is warm and dry.  Neurological:     Mental Status: He is alert and oriented to person, place, and time.  Psychiatric:        Behavior: Behavior normal.     ED Results / Procedures / Treatments   Labs (all labs ordered are listed, but only abnormal results are displayed) Labs Reviewed  CBC WITH DIFFERENTIAL/PLATELET - Abnormal; Notable for the following components:      Result Value   RBC 5.89 (*)    MCV 77.8 (*)    MCH 24.8 (*)    RDW 17.9 (*)    All other components within normal limits  BASIC METABOLIC PANEL - Abnormal; Notable for the following components:   Sodium 133 (*)    Chloride 97 (*)    Glucose, Bld 118 (*)    All other components within normal limits    EKG None  Radiology No results found.  Procedures Procedures (including critical care time)  Medications Ordered in ED Medications  colchicine tablet 1.2 mg (1.2 mg Oral Given 04/28/19 1828)  colchicine tablet 0.6 mg (0.6 mg Oral Given 04/28/19 1915)  oxyCODONE-acetaminophen (PERCOCET/ROXICET) 5-325 MG per tablet 1 tablet (1 tablet Oral Given 04/28/19 1932)    ED Course  I have reviewed the triage vital signs and the nursing notes.  Pertinent labs & imaging results that were available during my care of the patient were reviewed by me and considered in my medical decision making (see chart for details).    MDM Rules/Calculators/A&P                     Patient with well-documented history of  recurrent pseudo-gout. He has no history of trauma and no infectious symptoms. Examination with significant swelling and tenderness to the left wrist and hand concerning for recurrent gout. Will treat with anti-inflammatories, plan for outpatient follow-up and return precautions.  On recheck after colchicine in ED pt feeling improved.    Final Clinical Impression(s) / ED Diagnoses Final diagnoses:  Pseudogout    Rx / DC Orders ED Discharge Orders  Ordered    colchicine 0.6 MG tablet  2 times daily     04/28/19 Herbert Spires, MD 04/28/19 [phone]

## 2019-04-28 NOTE — ED Triage Notes (Signed)
Pt bib ems from home with reports of gout to L hand onset today. States gout was affecting his legs yesterday and today woke up with pain/swelling to L hand. Denies any injury. Some ETOH on board. VSS with ems.

## 2019-04-28 NOTE — ED Notes (Signed)
Pt verbalized understanding of d/c instructions, s/s requiring return to ED, prescriptions and need to follow up with community health and wellness. Pt had o further questions at this time. Pt transported to exit via wheelchair.

## 2019-08-21 ENCOUNTER — Emergency Department (HOSPITAL_COMMUNITY)
Admission: EM | Admit: 2019-08-21 | Discharge: 2019-08-21 | Disposition: A | Discharge status: Home / Self Care | Payer: Medicaid Other | Attending: Emergency Medicine | Admitting: Emergency Medicine

## 2019-08-21 ENCOUNTER — Emergency Department (HOSPITAL_COMMUNITY): Payer: Medicaid Other

## 2019-08-21 DIAGNOSIS — F1590 Other stimulant use, unspecified, uncomplicated: Secondary | ICD-10-CM | POA: Insufficient documentation

## 2019-08-21 DIAGNOSIS — F17210 Nicotine dependence, cigarettes, uncomplicated: Secondary | ICD-10-CM | POA: Diagnosis not present

## 2019-08-21 DIAGNOSIS — M1A0410 Idiopathic chronic gout, right hand, without tophus (tophi): Secondary | ICD-10-CM

## 2019-08-21 DIAGNOSIS — M79641 Pain in right hand: Secondary | ICD-10-CM | POA: Diagnosis present

## 2019-08-21 DIAGNOSIS — Z880 Allergy status to penicillin: Secondary | ICD-10-CM | POA: Diagnosis not present

## 2019-08-21 MED ORDER — IBUPROFEN 400 MG PO TABS: 400.0000 mg | ORAL_TABLET | Freq: Four times a day (QID) | ORAL | 0 refills | Status: AC | PRN

## 2019-08-21 MED ORDER — IBUPROFEN 400 MG PO TABS
600.0000 mg | ORAL_TABLET | Freq: Once | ORAL | Status: AC
  Administered 2019-08-21: 600 mg via ORAL
  Filled 2019-08-21: qty 1

## 2019-08-21 NOTE — ED Triage Notes (Signed)
Pt bib ems from home with R hand pain. Hx of gout and arthritis. Pt endorses etoh and marijuana last night. Pt ambulatory on scene. Some swelling noted to Rhand.  212/114 76 HR 96% RA 194 CBG 98.54F

## 2019-08-21 NOTE — ED Notes (Signed)
Patient refusing to apply wrist splint in department at this time. Patient taking splint home. Trifan, MD notified of patient's blood pressure at discharge.

## 2019-08-21 NOTE — ED Provider Notes (Signed)
[facility] EMERGENCY DEPARTMENT Provider Note   CSN: [id number] Arrival date & time: 08/21/19  7353     History Chief Complaint  Patient presents with  . Hand Pain    Keith Deleon is a 64 y.o. male history of gout presenting to ED with right hand pain.  This went on for a few days.  The patient is very tired during my exam continuously falls asleep, is difficult to get more of a history.  Reports pain diffusely in his right hand but worse in his right thumb.  He denies punching anything or striking his hand on anything.  He denies any open wounds.  He denies any fevers.  HPI     Past Medical History:  Diagnosis Date  . Arthritis   . Gout   . Hepatitis C   . Knee pain   . Osteoarthritis of right shoulder 10/31/2013  . Tuberculosis 88   tb rx    Patient Active Problem List   Diagnosis Date Noted  . Osteoarthritis of right shoulder 10/31/2013  . Primary osteoarthritis of right shoulder 10/31/2013    Past Surgical History:  Procedure Laterality Date  . right knee surgery     cartilage  . TOTAL SHOULDER ARTHROPLASTY Right 10/31/2013   Procedure: RIGHT TOTAL SHOULDER ARTHROPLASTY;  Surgeon: Eulas Post, MD;  Location: [facility] OR;  Service: Orthopedics;  Laterality: Right;  Right shoulder Hemi-arthroplasty      No family history on file.  Social History   Tobacco Use  . Smoking status: Current Every Day Smoker    Packs/day: 0.25    Years: 25.00    Pack years: 6.25    Types: Cigarettes  . Smokeless tobacco: Never Used  Substance Use Topics  . Alcohol use: Yes    Comment: 2-  40oz per day beer  . Drug use: Yes    Types: Marijuana    Home Medications Prior to Admission medications   Medication Sig Start Date End Date Taking? Authorizing Provider  acetaminophen (TYLENOL) 500 MG tablet Take 500-1,000 mg by mouth every 6 (six) hours as needed (for pain).    [provider]  colchicine 0.6 MG tablet Take 1 tablet (0.6 mg total) by mouth  2 (two) times daily. 04/28/19   Tilden Fossa, MD  ibuprofen (ADVIL) 400 MG tablet Take 1 tablet (400 mg total) by mouth every 6 (six) hours as needed for mild pain or moderate pain. 08/21/19 09/20/19  Terald Sleeper, MD  indomethacin (INDOCIN) 50 MG capsule Take 1 capsule (50 mg total) by mouth 2 (two) times daily with a meal. Patient not taking: Reported on 04/28/2019 03/13/19   Renne Crigler, PA-C  levofloxacin (LEVAQUIN) 500 MG tablet Take 1 tablet (500 mg total) by mouth daily. Patient not taking: Reported on 04/28/2019 11/01/18   Raeford Razor, MD  naproxen (NAPROSYN) 500 MG tablet Take 1 tablet (500 mg total) by mouth 2 (two) times daily. Patient not taking: Reported on 04/28/2019 11/04/18   Ronnie Doss A, PA-C  naproxen sodium (ALEVE) 220 MG tablet Take 220-440 mg by mouth 2 (two) times daily as needed (for pain).    [provider]    Allergies    Chlorhexidine gluconate and Penicillins  Review of Systems   Review of Systems  Constitutional: Negative for chills and fever.  Respiratory: Negative for cough and shortness of breath.   Cardiovascular: Negative for chest pain and palpitations.  Gastrointestinal: Negative for abdominal pain and vomiting.  Musculoskeletal:  Positive for arthralgias and myalgias.  Skin: Negative for rash and wound.  Neurological: Negative for weakness and numbness.  Psychiatric/Behavioral: Negative for agitation and confusion.  All other systems reviewed and are negative.   Physical Exam Updated Vital Signs BP (!) 200/110 (BP Location: Left Arm)   Pulse 78   Temp 99.7 F (37.6 C) (Oral)   Resp 16   SpO2 98%   Physical Exam Vitals and nursing note reviewed.  Constitutional:      Appearance: He is well-developed.     Comments: Falling asleep during exam  HENT:     Head: Normocephalic and atraumatic.  Eyes:     Conjunctiva/sclera: Conjunctivae normal.  Cardiovascular:     Rate and Rhythm: Normal rate and regular rhythm.     Pulses:  Normal pulses.  Pulmonary:     Effort: Pulmonary effort is normal. No respiratory distress.  Musculoskeletal:     Cervical back: Neck supple.     Comments: Tenderness of right proximal thumb with mild warmth  Skin:    General: Skin is warm and dry.  Neurological:     Mental Status: He is alert.     ED Results / Procedures / Treatments   Labs (all labs ordered are listed, but only abnormal results are displayed) Labs Reviewed - No data to display  EKG None  Radiology DG Hand Complete Right  Result Date: 08/21/2019 CLINICAL DATA:  Acute right hand pain and swelling without known injury. EXAM: RIGHT HAND - COMPLETE 3+ VIEW COMPARISON:  None. FINDINGS: There is no evidence of acute fracture or dislocation. Mild degenerative changes seen involving the first carpometacarpal joint. Deformity of fifth metacarpal is noted consistent with old healed fracture. Soft tissues are unremarkable. IMPRESSION: Mild degenerative joint disease of the first carpometacarpal joint. No acute abnormality seen in the right hand. Electronically Signed   By: Lupita Raider M.D.   On: 08/21/2019 08:39    Procedures Procedures (including critical care time)  Medications Ordered in ED Medications  ibuprofen (ADVIL) tablet 600 mg (600 mg Oral Given 08/21/19 1235)    ED Course  I have reviewed the triage vital signs and the nursing notes.  Pertinent labs & imaging results that were available during my care of the patient were reviewed by me and considered in my medical decision making (see chart for details).  64 yo male w/ hx of gout presenting with suspected gout flare in his right hand.  Doubtful of septic joint on exam.  Xrays with no acute fracture; he has no reported traumatic injuries to his hand or wrist.  We'll give motrin here and discharge.  I offered a wrist splint for comfort only, but he does not need to wear it otherwise.     Final Clinical Impression(s) / ED Diagnoses Final diagnoses:    Chronic gout of right hand, unspecified cause    Rx / DC Orders ED Discharge Orders         Ordered    ibuprofen (ADVIL) 400 MG tablet  Every 6 hours PRN     Discontinue  Reprint     08/21/19 1207           Terald Sleeper, MD 08/21/19 1732

## 2020-09-24 ENCOUNTER — Ambulatory Visit (HOSPITAL_COMMUNITY)
Admission: EM | Admit: 2020-09-24 | Discharge: 2020-09-24 | Disposition: A | Discharge status: Home / Self Care | Payer: Medicaid Other | Attending: Emergency Medicine | Admitting: Emergency Medicine

## 2020-09-24 ENCOUNTER — Other Ambulatory Visit: Payer: Self-pay

## 2020-09-24 ENCOUNTER — Encounter (HOSPITAL_COMMUNITY): Payer: Self-pay | Admitting: *Deleted

## 2020-09-24 DIAGNOSIS — L02414 Cutaneous abscess of left upper limb: Secondary | ICD-10-CM

## 2020-09-24 DIAGNOSIS — L03114 Cellulitis of left upper limb: Secondary | ICD-10-CM | POA: Diagnosis not present

## 2020-09-24 MED ORDER — SULFAMETHOXAZOLE-TRIMETHOPRIM 800-160 MG PO TABS: 1.0000 | ORAL_TABLET | Freq: Two times a day (BID) | ORAL | 0 refills | Status: AC

## 2020-09-24 MED ORDER — LIDOCAINE-EPINEPHRINE 1 %-1:100000 IJ SOLN
INTRAMUSCULAR | Status: AC
  Filled 2020-09-24: qty 1

## 2020-09-24 NOTE — Discharge Instructions (Addendum)
Return in 24 hour is worse  Wash dry area  Take full dose of abx  Remove packing in 24 hours  Take motrin as needed for pain

## 2020-09-24 NOTE — ED Triage Notes (Signed)
Pt presents with a Lg raised area of skin on anterior Lt forearm.

## 2020-09-24 NOTE — ED Provider Notes (Addendum)
[facility]    CSN: [id number] Arrival date & time: 09/24/20  1051      History   Chief Complaint No chief complaint on file.   HPI Keith Deleon is a 65 y.o. male.   LT FA has a raised abscess to area, swelling and pain for 3 days. Has never had before , has not taken anything pta. Denies any fevers no n/v/d    Past Medical History:  Diagnosis Date   Arthritis    Gout    Hepatitis C    Knee pain    Osteoarthritis of right shoulder 10/31/2013   Tuberculosis 88   tb rx    Patient Active Problem List   Diagnosis Date Noted   Osteoarthritis of right shoulder 10/31/2013   Primary osteoarthritis of right shoulder 10/31/2013    Past Surgical History:  Procedure Laterality Date   right knee surgery     cartilage   TOTAL SHOULDER ARTHROPLASTY Right 10/31/2013   Procedure: RIGHT TOTAL SHOULDER ARTHROPLASTY;  Surgeon: Eulas Post, MD;  Location: [facility] OR;  Service: Orthopedics;  Laterality: Right;  Right shoulder Hemi-arthroplasty       Home Medications    Prior to Admission medications   Medication Sig Start Date End Date Taking? Authorizing Provider  sulfamethoxazole-trimethoprim (BACTRIM DS) 800-160 MG tablet Take 1 tablet by mouth 2 (two) times daily for 7 days. 09/24/20 10/01/20 Yes Coralyn Mark, NP  acetaminophen (TYLENOL) 500 MG tablet Take 500-1,000 mg by mouth every 6 (six) hours as needed (for pain).    [provider]  colchicine 0.6 MG tablet Take 1 tablet (0.6 mg total) by mouth 2 (two) times daily. 04/28/19   Tilden Fossa, MD  indomethacin (INDOCIN) 50 MG capsule Take 1 capsule (50 mg total) by mouth 2 (two) times daily with a meal. Patient not taking: Reported on 04/28/2019 03/13/19   Renne Crigler, PA-C  levofloxacin (LEVAQUIN) 500 MG tablet Take 1 tablet (500 mg total) by mouth daily. Patient not taking: Reported on 04/28/2019 11/01/18   Raeford Razor, MD  naproxen (NAPROSYN) 500 MG tablet Take 1 tablet (500 mg total) by mouth 2  (two) times daily. Patient not taking: Reported on 04/28/2019 11/04/18   Ronnie Doss A, PA-C  naproxen sodium (ALEVE) 220 MG tablet Take 220-440 mg by mouth 2 (two) times daily as needed (for pain).    [provider]    Family History History reviewed. No pertinent family history.  Social History Social History   Tobacco Use   Smoking status: Every Day    Packs/day: 0.25    Years: 25.00    Pack years: 6.25    Types: Cigarettes   Smokeless tobacco: Never  Substance Use Topics   Alcohol use: Yes    Comment: 2-  40oz per day beer   Drug use: Yes    Types: Marijuana     Allergies   Chlorhexidine gluconate and Penicillins   Review of Systems Review of Systems  Constitutional:  Negative for activity change, chills and fever.  Respiratory: Negative.    Cardiovascular: Negative.   Gastrointestinal: Negative.   Genitourinary: Negative.   Skin:  Positive for wound.       Raised abscess painful to LT  FA, non draining.     Physical Exam Triage Vital Signs ED Triage Vitals  Enc Vitals Group     BP 09/24/20 1120 (!) 156/83     Pulse Rate 09/24/20 1120 67     Resp  09/24/20 1120 20     Temp 09/24/20 1120 98.8 F (37.1 C)     Temp src --      SpO2 09/24/20 1120 97 %     Weight --      Height --      Head Circumference --      Peak Flow --      Pain Score 09/24/20 1118 10     Pain Loc --      Pain Edu? --      Excl. in GC? --    No data found.  Updated Vital Signs BP (!) 156/83   Pulse 67   Temp 98.8 F (37.1 C)   Resp 20   SpO2 97%   Visual Acuity Right Eye Distance:   Left Eye Distance:   Bilateral Distance:    Right Eye Near:   Left Eye Near:    Bilateral Near:     Physical Exam Constitutional:      Appearance: Normal appearance.  Cardiovascular:     Rate and Rhythm: Normal rate.  Pulmonary:     Effort: Pulmonary effort is normal.  Abdominal:     General: Abdomen is flat.  Skin:    Findings: Erythema present.     Comments: LT FA  quarter size raised abscess with erythema noted surrounding the tissue. Non draining .   Neurological:     Mental Status: He is alert.     UC Treatments / Results  Labs (all labs ordered are listed, but only abnormal results are displayed) Labs Reviewed - No data to display  EKG   Radiology No results found.  Procedures Incision and Drainage  Date/Time: 09/24/2020 1:23 PM Performed by: Coralyn Mark, NP Authorized by: Coralyn Mark, NP   Consent:    Consent obtained:  Verbal   Consent given by:  Patient   Risks, benefits, and alternatives were discussed: yes     Risks discussed:  Bleeding, incomplete drainage, pain and infection   Alternatives discussed:  Alternative treatment Universal protocol:    Procedure explained and questions answered to patient or proxy's satisfaction: yes     Test results available : no     Imaging studies available: no     Required blood products, implants, devices, and special equipment available: no     Site/side marked: yes     Immediately prior to procedure, a time out was called: yes     Patient identity confirmed:  Verbally with patient and arm band Location:    Type:  Abscess   Size:  Quarter size   Location:  Upper extremity   Upper extremity location:  Arm   Arm location:  L upper arm Pre-procedure details:    Skin preparation:  Antiseptic wash Sedation:    Sedation type:  None Anesthesia:    Anesthesia method:  Local infiltration   Local anesthetic:  Lidocaine 1% WITH epi Procedure type:    Complexity:  Simple Procedure details:    Ultrasound guidance: no     Needle aspiration: no     Incision types:  Single straight   Wound management:  Irrigated with saline   Drainage:  Serosanguinous   Drainage amount:  Moderate   Packing materials:  1/4 in iodoform gauze Post-procedure details:    Procedure completion:  Tolerated well, no immediate complications (including critical care time)  Medications Ordered in  UC Medications - No data to display  Initial Impression / Assessment and Plan / UC  Course  I have reviewed the triage vital signs and the nursing notes.  Pertinent labs & imaging results that were available during my care of the patient were reviewed by me and considered in my medical decision making (see chart for details).     Wash dry area  Take full dose of abx  Remove packing in 24 hours  Take motrin as needed for pain  Final Clinical Impressions(s) / UC Diagnoses   Final diagnoses:  Cellulitis of left upper arm  Abscess of left forearm     Discharge Instructions      Return in 24 hour is worse  Wash dry area  Take full dose of abx  Remove packing in 24 hours  Take motrin as needed for pain      ED Prescriptions     Medication Sig Dispense Auth. Provider   sulfamethoxazole-trimethoprim (BACTRIM DS) 800-160 MG tablet Take 1 tablet by mouth 2 (two) times daily for 7 days. 14 tablet Coralyn Mark, NP      PDMP not reviewed this encounter.   Coralyn Mark, NP 09/24/20 1328    Coralyn Mark, NP 10/03/20 [phone]

## 2020-10-29 DIAGNOSIS — Z96611 Presence of right artificial shoulder joint: Secondary | ICD-10-CM | POA: Diagnosis not present

## 2020-10-29 DIAGNOSIS — F17210 Nicotine dependence, cigarettes, uncomplicated: Secondary | ICD-10-CM | POA: Insufficient documentation

## 2020-10-29 DIAGNOSIS — T50901A Poisoning by unspecified drugs, medicaments and biological substances, accidental (unintentional), initial encounter: Secondary | ICD-10-CM | POA: Insufficient documentation

## 2020-10-29 DIAGNOSIS — R410 Disorientation, unspecified: Secondary | ICD-10-CM | POA: Insufficient documentation

## 2020-10-30 ENCOUNTER — Emergency Department (HOSPITAL_COMMUNITY)
Admission: EM | Admit: 2020-10-30 | Discharge: 2020-10-30 | Disposition: A | Discharge status: Home / Self Care | Payer: Medicare HMO | Attending: Emergency Medicine | Admitting: Emergency Medicine

## 2020-10-30 ENCOUNTER — Other Ambulatory Visit: Payer: Self-pay

## 2020-10-30 ENCOUNTER — Encounter (HOSPITAL_COMMUNITY): Payer: Self-pay

## 2020-10-30 DIAGNOSIS — T50901A Poisoning by unspecified drugs, medicaments and biological substances, accidental (unintentional), initial encounter: Secondary | ICD-10-CM

## 2020-10-30 NOTE — ED Notes (Signed)
Pt is up and is trying to figure out who to call to pick him up.

## 2020-10-30 NOTE — ED Triage Notes (Signed)
Pt is alert and oriented x2

## 2020-10-30 NOTE — ED Provider Notes (Signed)
[facility]-EMERGENCY DEPT [facility] Emergency Department Provider Note MRN:  [id number]  Arrival date & time: 10/30/20     Chief Complaint   Drug Overdose (Pt was found unresponsive, was given 2 mg of narcan and came back around)   History of Present Illness   Keith Deleon is a 65 y.o. year-old male with no pertinent past medical history presenting to the ED with chief complaint of drug overdose.  Patient found unresponsive with EMS, was given Narcan and woke up.  Patient remains altered, somnolent.  I was unable to obtain an accurate HPI, PMH, or ROS due to the patient's altered mental status.  Level 5 caveat. Review of Systems  Positive for altered mental status.  Patient's Health History    Past Medical History:  Diagnosis Date   Arthritis    Gout    Hepatitis C    Knee pain    Osteoarthritis of right shoulder 10/31/2013   Tuberculosis 88   tb rx    Past Surgical History:  Procedure Laterality Date   right knee surgery     cartilage   TOTAL SHOULDER ARTHROPLASTY Right 10/31/2013   Procedure: RIGHT TOTAL SHOULDER ARTHROPLASTY;  Surgeon: Eulas Post, MD;  Location: [facility] OR;  Service: Orthopedics;  Laterality: Right;  Right shoulder Hemi-arthroplasty    History reviewed. No pertinent family history.  Social History   Socioeconomic History   Marital status: Single    Spouse name: Not on file   Number of children: Not on file   Years of education: Not on file   Highest education level: Not on file  Occupational History   Not on file  Tobacco Use   Smoking status: Every Day    Packs/day: 0.25    Years: 25.00    Pack years: 6.25    Types: Cigarettes   Smokeless tobacco: Never  Substance and Sexual Activity   Alcohol use: Yes    Comment: 2-  40oz per day beer   Drug use: Yes    Types: Marijuana   Sexual activity: Not on file  Other Topics Concern   Not on file  Social History Narrative   Not on file   Social Determinants of Health   Financial Resource  Strain: Not on file  Food Insecurity: Not on file  Transportation Needs: Not on file  Physical Activity: Not on file  Stress: Not on file  Social Connections: Not on file  Intimate Partner Violence: Not on file     Physical Exam   Vitals:   10/30/20 0218 10/30/20 0330  BP: (!) 165/89 (!) 162/108  Pulse: (!) 47 (!) 56  Resp: 20 17  Temp:    SpO2: 95% 96%    CONSTITUTIONAL: Well-appearing, NAD NEURO:  Alert and oriented to name, mildly confused/inebriated, no focal deficits, moving all extremities, no evidence of trauma to the head EYES:  eyes equal and reactive ENT/NECK:  no LAD, no JVD CARDIO: Regular rate, well-perfused, normal S1 and S2 PULM:  CTAB no wheezing or rhonchi GI/GU:  normal bowel sounds, non-distended, non-tender MSK/SPINE:  No gross deformities, no edema SKIN:  no rash, atraumatic PSYCH: Unable to assess  *Additional and/or pertinent findings included in MDM below  Diagnostic and Interventional Summary    EKG Interpretation  Date/Time:    Ventricular Rate:    PR Interval:    QRS Duration:   QT Interval:    QTC Calculation:   R Axis:     Text Interpretation:  Labs Reviewed - No data to display  No orders to display    Medications - No data to display   Procedures  /  Critical Care Procedures  ED Course and Medical Decision Making  I have reviewed the triage vital signs, the nursing notes, and pertinent available records from the EMR.  Listed above are laboratory and imaging tests that I personally ordered, reviewed, and interpreted and then considered in my medical decision making (see below for details).  Suspect opioid toxidrome, improved with Narcan per report.  Reassuring vital signs, mildly somnolent but conversant at this time.  Will observe closely.  No evidence of trauma, no complaints at this time.     Patient observed for 5 hours with no resedation, and repeat evaluation is doing very well, no longer altered, appropriate  for discharge.  Elmer Sow. Pilar Plate, MD [facility] Emergency Medicine [facility] [email or web address]  Final Clinical Impressions(s) / ED Diagnoses     ICD-10-CM   1. Accidental overdose, initial encounter  T50.901A       ED Discharge Orders     None        Discharge Instructions Discussed with and Provided to Patient:     Discharge Instructions      You were evaluated in the Emergency Department and after careful evaluation, we did not find any emergent condition requiring admission or further testing in the hospital.  Your exam/testing today was overall reassuring.  Please return to the Emergency Department if you experience any worsening of your condition.  Thank you for allowing [place] to be a part of your care.         Sabas Sous, MD 10/30/20 [phone]

## 2020-10-30 NOTE — Discharge Instructions (Addendum)
You were evaluated in the Emergency Department and after careful evaluation, we did not find any emergent condition requiring admission or further testing in the hospital.  Your exam/testing today was overall reassuring.  Please return to the Emergency Department if you experience any worsening of your condition.  Thank you for allowing us to be a part of your care.  

## 2021-02-20 IMAGING — DX PORTABLE CHEST - 1 VIEW
1 series · 1 of 1 positions shown · non-contrast
Comparison: 11/27/2016 and earlier.

CLINICAL DATA: Current smoker with personal history of
tuberculosis, presenting with syncope.

EXAM:
PORTABLE CHEST 1 VIEW

[chest ap]
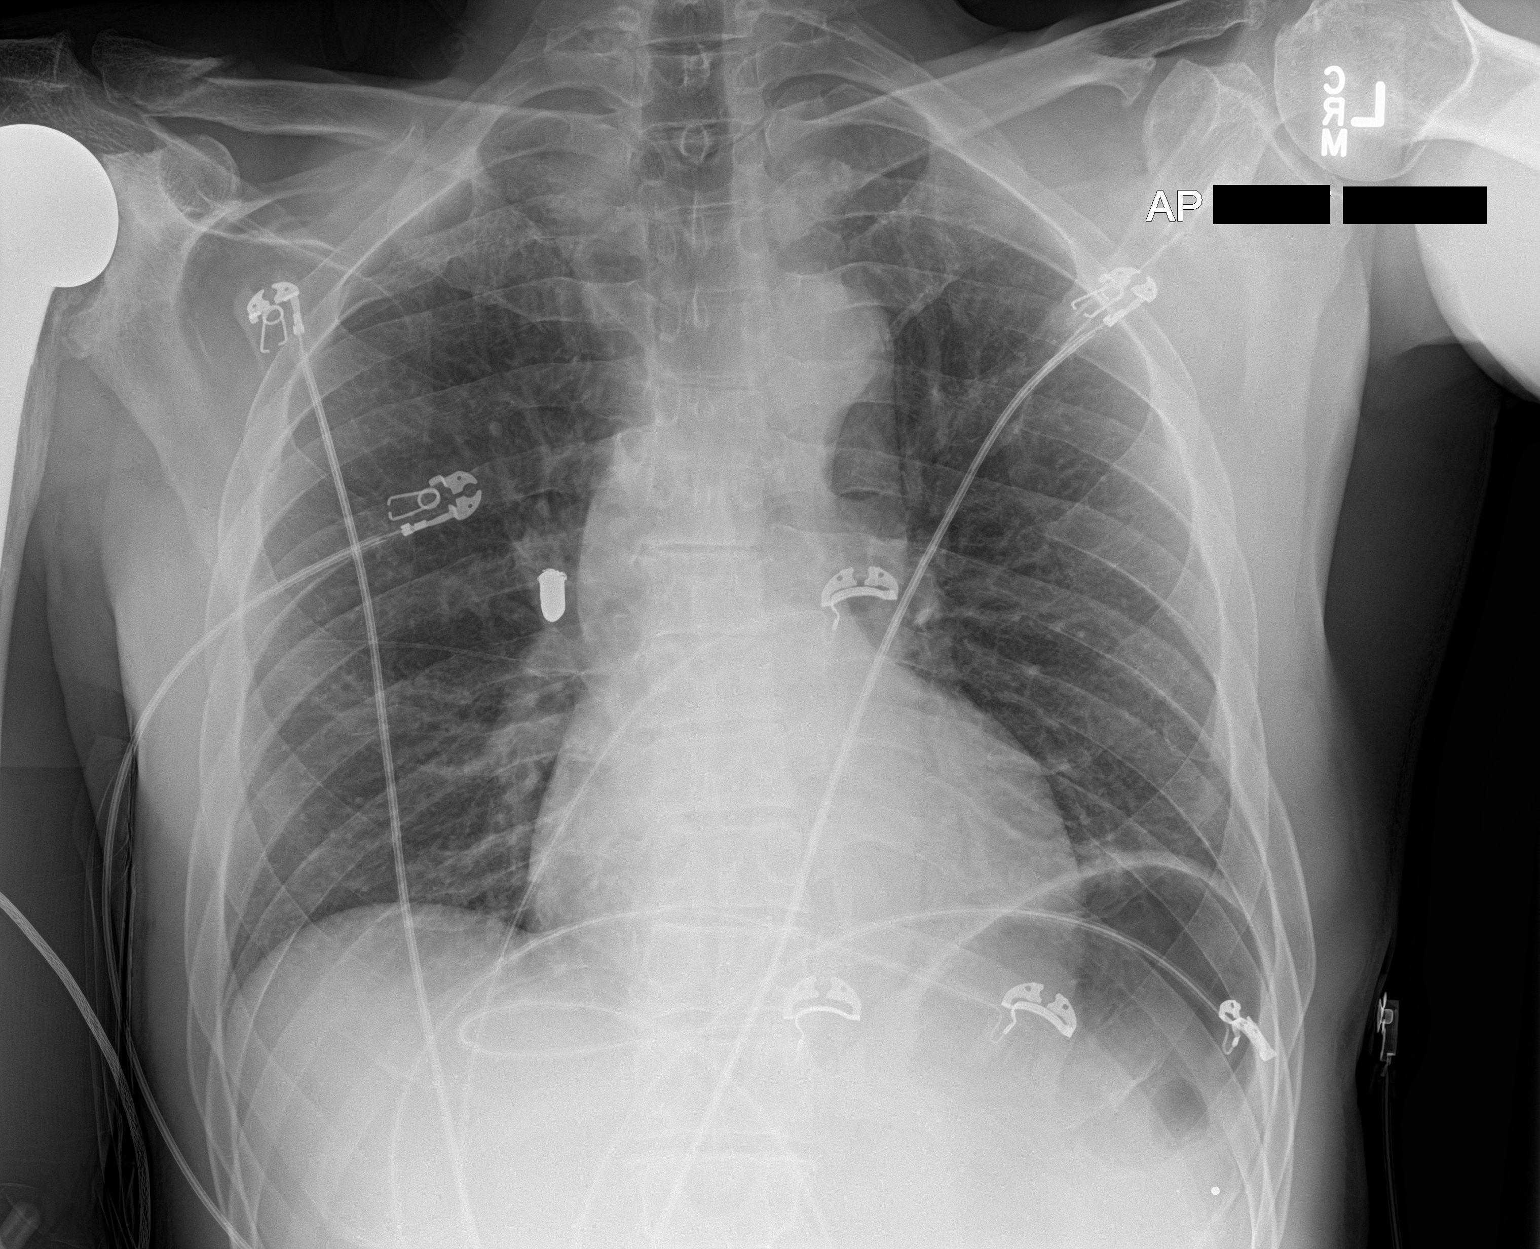

[1 of 1 positions shown; findings below may reference images not displayed]

FINDINGS: Cardiac silhouette mildly enlarged, unchanged since 1976, increased
in size since 0988. Thoracic aorta tortuous and mildly
atherosclerotic, unchanged. Hilar and mediastinal contours otherwise
unremarkable. Lungs clear. Bronchovascular markings normal.
Pulmonary vascularity normal. No visible pleural effusions. No
pneumothorax. Prior RIGHT shoulder hemiarthroplasty.
IMPRESSION: Stable mild cardiomegaly. No acute cardiopulmonary disease.

## 2021-02-20 IMAGING — CT CT CERVICAL SPINE WITHOUT CONTRAST
4 of 7 series · 13 of 33 positions shown, 14 images · non-contrast
Comparison: 04/11/2013

CLINICAL DATA: Loss of consciousness

EXAM:
CT HEAD WITHOUT CONTRAST
CT CERVICAL SPINE WITHOUT CONTRAST
TECHNIQUE: Multidetector CT imaging of the head and cervical spine was
performed following the standard protocol without intravenous
contrast. Multiplanar CT image reconstructions of the cervical spine
were also generated.

[Series 9: c_spine 2.0 st · axial · 0.24mm/px · z∈[-146,-48]mm · 4 of 83 slices shown, 5 images]
[im 17/83  soft-tissue]
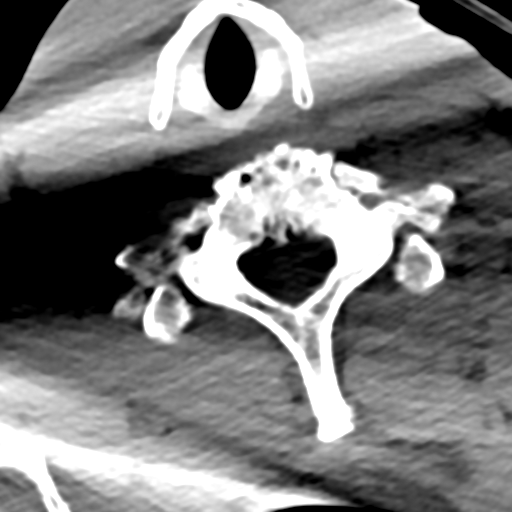
[im 17/83  bone]
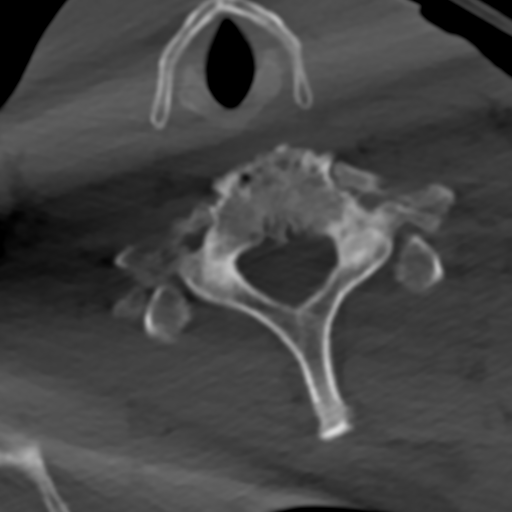
[im 33/83  bone]
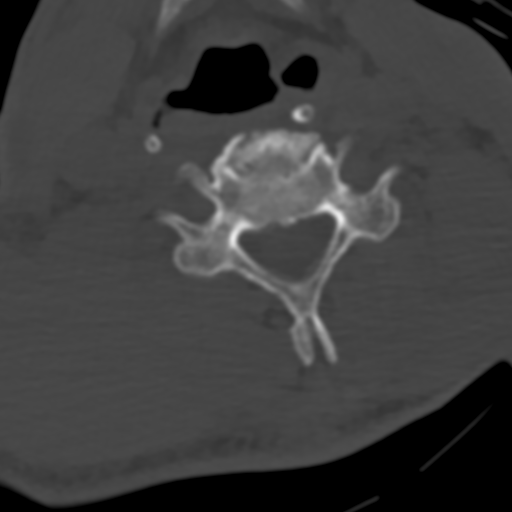
[im 50/83  bone]
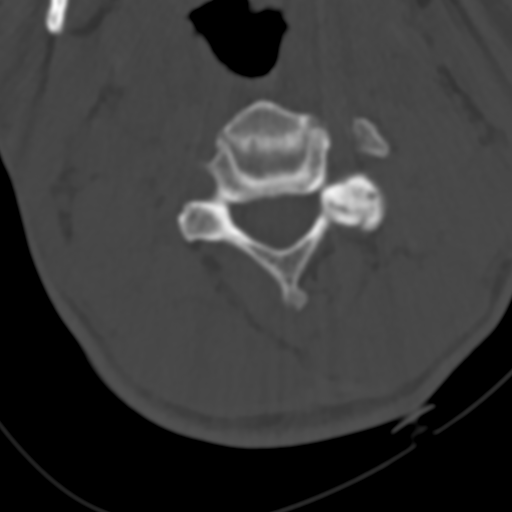
[im 66/83  bone]
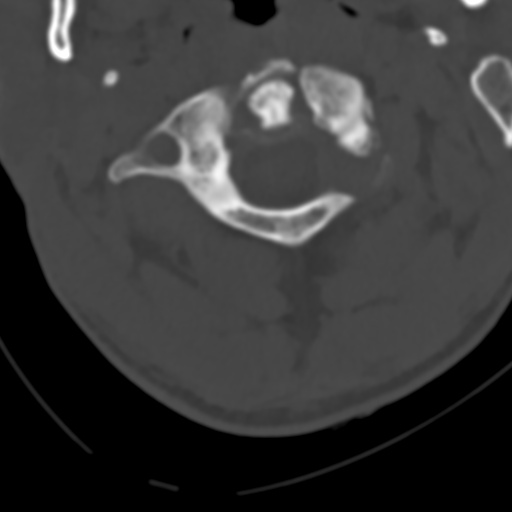

[Series 10: coronal bone · coronal · 0.22mm/px · 1 of 61 slices shown]
[im 31/61  bone]
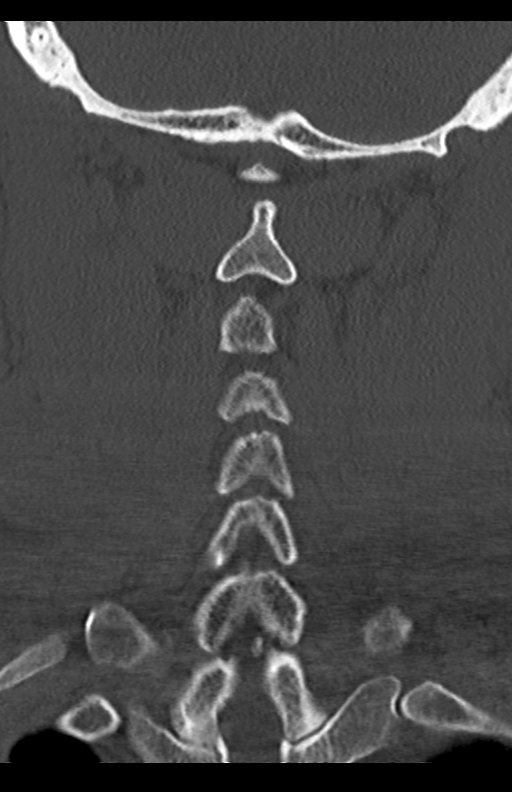

[Series 11: sagittal bone · sagittal · 0.23mm/px · 4 of 52 slices shown]
[im 11/52  bone]
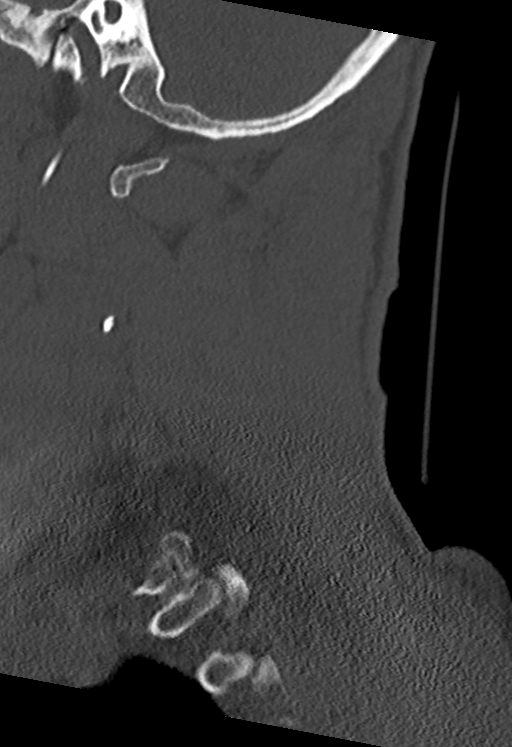
[im 21/52  bone]
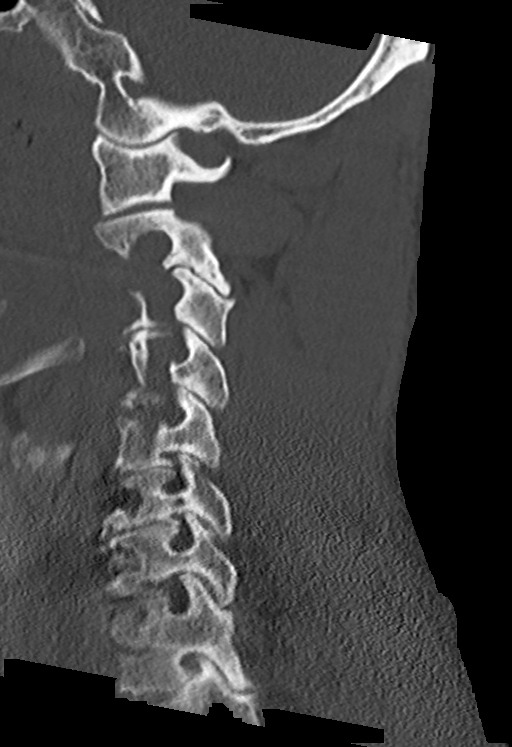
[im 31/52  bone]
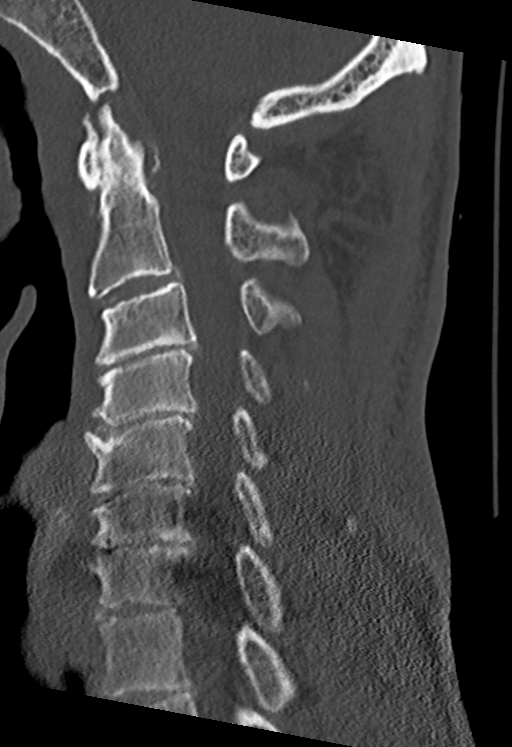
[im 41/52  bone]
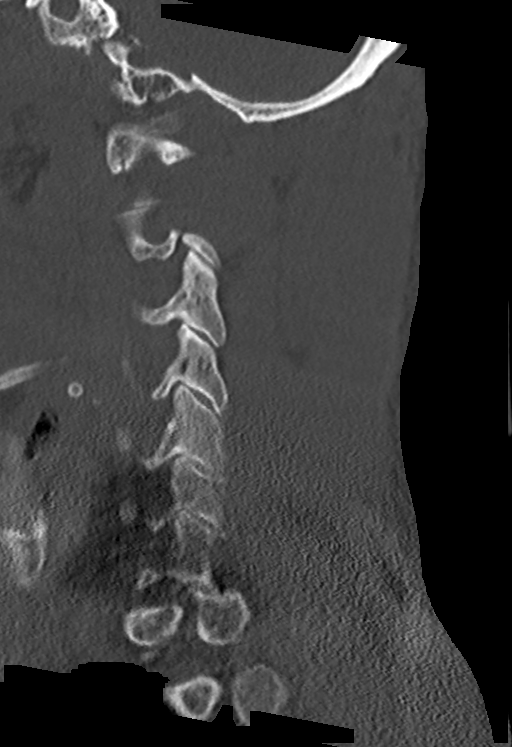

[Series 13: orthogonal axial st · axial · 0.21mm/px · z∈[-156,-76]mm · 4 of 77 slices shown]
[im 16/77  bone]
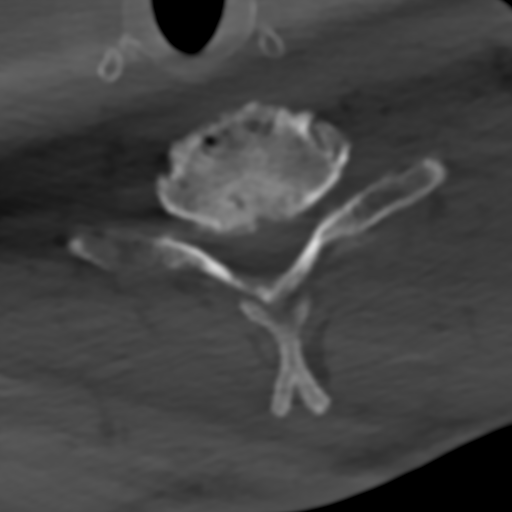
[im 31/77  bone]
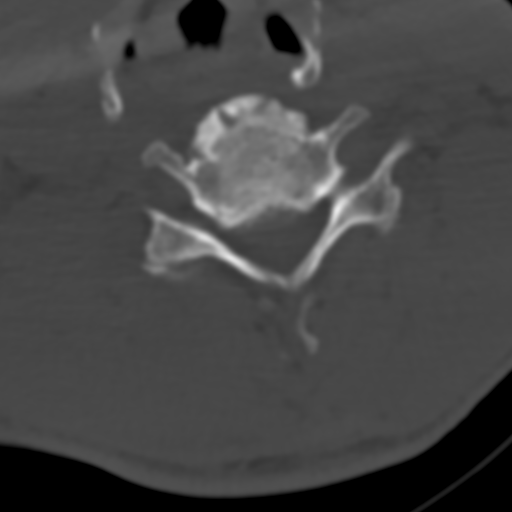
[im 46/77  bone]
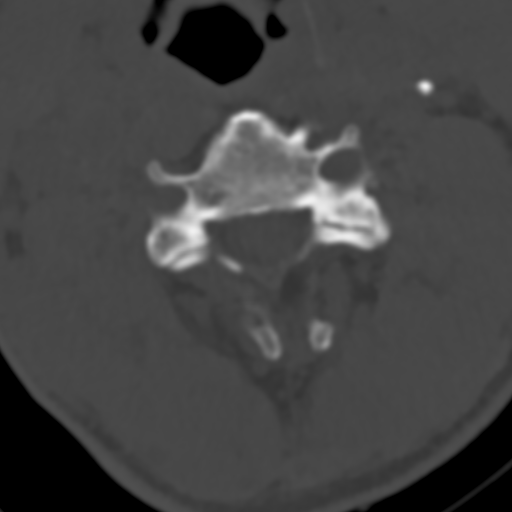
[im 61/77  bone]
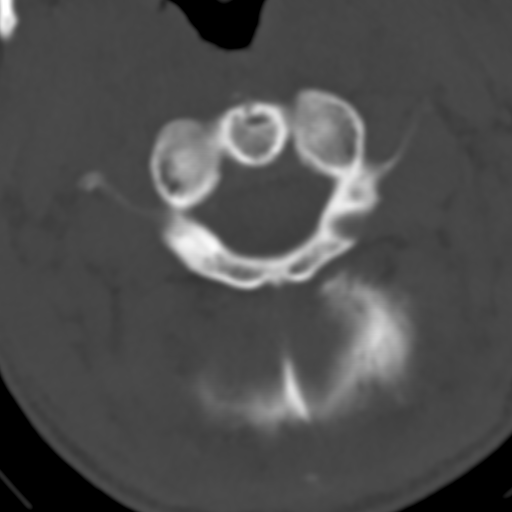

[13 of 33 positions shown; findings below may reference images not displayed]

FINDINGS: CT HEAD FINDINGS

Brain: No evidence of acute infarction, hemorrhage, hydrocephalus,
extra-axial collection or mass lesion/mass effect. Periventricular
white matter hypodensity.

Vascular: No hyperdense vessel or unexpected calcification.

Skull: Normal. Negative for fracture or focal lesion.

Sinuses/Orbits: No acute finding.

Other: None.

CT CERVICAL SPINE FINDINGS

Alignment: Degenerative straightening of the normal cervical
lordosis.

Skull base and vertebrae: No acute fracture. No primary bone lesion
or focal pathologic process.

Soft tissues and spinal canal: No prevertebral fluid or swelling. No
visible canal hematoma.

Disc levels: Moderate multilevel disc degenerative disease and
osteophytosis.

Upper chest: Negative.

Other: None.
IMPRESSION: 1. No acute intracranial pathology. Small-vessel white matter
disease.

2. No fracture or static subluxation of the cervical spine.
Multilevel disc degenerative disease.

## 2021-05-31 ENCOUNTER — Inpatient Hospital Stay (HOSPITAL_COMMUNITY): Payer: Medicare HMO

## 2021-05-31 ENCOUNTER — Inpatient Hospital Stay (HOSPITAL_COMMUNITY)
Admission: EM | Admit: 2021-05-31 | Discharge: 2021-06-02 | DRG: 480 | Disposition: A | Discharge status: Home Health | Payer: Medicare HMO | Attending: Internal Medicine | Admitting: Internal Medicine

## 2021-05-31 ENCOUNTER — Emergency Department (HOSPITAL_COMMUNITY): Payer: Medicare HMO

## 2021-05-31 ENCOUNTER — Other Ambulatory Visit: Payer: Self-pay

## 2021-05-31 ENCOUNTER — Inpatient Hospital Stay (HOSPITAL_COMMUNITY): Payer: Medicare HMO | Admitting: Anesthesiology

## 2021-05-31 ENCOUNTER — Encounter (HOSPITAL_COMMUNITY): Payer: Self-pay | Admitting: Emergency Medicine

## 2021-05-31 ENCOUNTER — Encounter (HOSPITAL_COMMUNITY)
Admission: EM | Disposition: A | Discharge status: Home Health | Payer: Self-pay | Source: Home / Self Care | Attending: Internal Medicine

## 2021-05-31 DIAGNOSIS — F1090 Alcohol use, unspecified, uncomplicated: Secondary | ICD-10-CM | POA: Diagnosis present

## 2021-05-31 DIAGNOSIS — Z888 Allergy status to other drugs, medicaments and biological substances status: Secondary | ICD-10-CM

## 2021-05-31 DIAGNOSIS — F14129 Cocaine abuse with intoxication, unspecified: Secondary | ICD-10-CM | POA: Diagnosis present

## 2021-05-31 DIAGNOSIS — Z8249 Family history of ischemic heart disease and other diseases of the circulatory system: Secondary | ICD-10-CM

## 2021-05-31 DIAGNOSIS — R7303 Prediabetes: Secondary | ICD-10-CM | POA: Diagnosis present

## 2021-05-31 DIAGNOSIS — D72829 Elevated white blood cell count, unspecified: Secondary | ICD-10-CM | POA: Diagnosis not present

## 2021-05-31 DIAGNOSIS — G9341 Metabolic encephalopathy: Secondary | ICD-10-CM | POA: Diagnosis present

## 2021-05-31 DIAGNOSIS — Z8619 Personal history of other infectious and parasitic diseases: Secondary | ICD-10-CM | POA: Diagnosis not present

## 2021-05-31 DIAGNOSIS — S72141A Displaced intertrochanteric fracture of right femur, initial encounter for closed fracture: Secondary | ICD-10-CM | POA: Diagnosis present

## 2021-05-31 DIAGNOSIS — D649 Anemia, unspecified: Secondary | ICD-10-CM | POA: Diagnosis present

## 2021-05-31 DIAGNOSIS — Y9355 Activity, bike riding: Secondary | ICD-10-CM | POA: Diagnosis not present

## 2021-05-31 DIAGNOSIS — M109 Gout, unspecified: Secondary | ICD-10-CM | POA: Diagnosis present

## 2021-05-31 DIAGNOSIS — S72001A Fracture of unspecified part of neck of right femur, initial encounter for closed fracture: Principal | ICD-10-CM

## 2021-05-31 DIAGNOSIS — M1129 Other chondrocalcinosis, multiple sites: Secondary | ICD-10-CM | POA: Diagnosis present

## 2021-05-31 DIAGNOSIS — F1010 Alcohol abuse, uncomplicated: Secondary | ICD-10-CM | POA: Diagnosis present

## 2021-05-31 DIAGNOSIS — M1990 Unspecified osteoarthritis, unspecified site: Secondary | ICD-10-CM | POA: Diagnosis present

## 2021-05-31 DIAGNOSIS — I10 Essential (primary) hypertension: Secondary | ICD-10-CM | POA: Diagnosis present

## 2021-05-31 DIAGNOSIS — Z96611 Presence of right artificial shoulder joint: Secondary | ICD-10-CM | POA: Diagnosis present

## 2021-05-31 DIAGNOSIS — R7401 Elevation of levels of liver transaminase levels: Secondary | ICD-10-CM | POA: Diagnosis present

## 2021-05-31 DIAGNOSIS — F17290 Nicotine dependence, other tobacco product, uncomplicated: Secondary | ICD-10-CM | POA: Diagnosis present

## 2021-05-31 DIAGNOSIS — Z880 Allergy status to penicillin: Secondary | ICD-10-CM

## 2021-05-31 DIAGNOSIS — F12929 Cannabis use, unspecified with intoxication, unspecified: Secondary | ICD-10-CM | POA: Diagnosis present

## 2021-05-31 DIAGNOSIS — S72144A Nondisplaced intertrochanteric fracture of right femur, initial encounter for closed fracture: Secondary | ICD-10-CM | POA: Diagnosis not present

## 2021-05-31 HISTORY — PX: INTRAMEDULLARY (IM) NAIL INTERTROCHANTERIC: SHX5875

## 2021-05-31 LAB — CBC WITH DIFFERENTIAL/PLATELET
Abs Immature Granulocytes: 0.05 10*3/uL (ref 0.00–0.07)
Basophils Absolute: 0 10*3/uL (ref 0.0–0.1)
Basophils Relative: 0 %
Eosinophils Absolute: 0 10*3/uL (ref 0.0–0.5)
Eosinophils Relative: 0 %
HCT: 39.3 % (ref 39.0–52.0)
Hemoglobin: 12.6 g/dL — ABNORMAL LOW (ref 13.0–17.0)
Immature Granulocytes: 1 %
Lymphocytes Relative: 12 %
Lymphs Abs: 1 10*3/uL (ref 0.7–4.0)
MCH: 26.7 pg (ref 26.0–34.0)
MCHC: 32.1 g/dL (ref 30.0–36.0)
MCV: 83.3 fL (ref 80.0–100.0)
Monocytes Absolute: 0.7 10*3/uL (ref 0.1–1.0)
Monocytes Relative: 8 %
Neutro Abs: 6.8 10*3/uL (ref 1.7–7.7)
Neutrophils Relative %: 79 %
Platelets: 184 10*3/uL (ref 150–400)
RBC: 4.72 MIL/uL (ref 4.22–5.81)
RDW: 15.4 % (ref 11.5–15.5)
WBC: 8.6 10*3/uL (ref 4.0–10.5)
nRBC: 0 % (ref 0.0–0.2)

## 2021-05-31 LAB — COMPREHENSIVE METABOLIC PANEL
ALT: 74 U/L — ABNORMAL HIGH (ref 0–44)
AST: 65 U/L — ABNORMAL HIGH (ref 15–41)
Albumin: 3.5 g/dL (ref 3.5–5.0)
Alkaline Phosphatase: 55 U/L (ref 38–126)
Anion gap: 8 (ref 5–15)
BUN: 12 mg/dL (ref 8–23)
CO2: 23 mmol/L (ref 22–32)
Calcium: 8.6 mg/dL — ABNORMAL LOW (ref 8.9–10.3)
Chloride: 105 mmol/L (ref 98–111)
Creatinine, Ser: 0.88 mg/dL (ref 0.61–1.24)
GFR, Estimated: >60 mL/min (ref >60)
Glucose, Bld: 129 mg/dL — ABNORMAL HIGH (ref 70–99)
Potassium: 4.3 mmol/L (ref 3.5–5.1)
Sodium: 136 mmol/L (ref 135–145)
Total Bilirubin: 0.9 mg/dL (ref 0.3–1.2)
Total Protein: 7 g/dL (ref 6.5–8.1)

## 2021-05-31 LAB — RAPID URINE DRUG SCREEN, HOSP PERFORMED
Amphetamines: POSITIVE — AB
Barbiturates: NOT DETECTED
Benzodiazepines: NOT DETECTED
Cocaine: POSITIVE — AB
Opiates: NOT DETECTED
Tetrahydrocannabinol: POSITIVE — AB

## 2021-05-31 LAB — ETHANOL: Alcohol, Ethyl (B): <10 mg/dL (ref <10)

## 2021-05-31 SURGERY — FIXATION, FRACTURE, INTERTROCHANTERIC, WITH INTRAMEDULLARY ROD
Anesthesia: General | Site: Hip | Laterality: Right

## 2021-05-31 MED ORDER — HYDROMORPHONE HCL 1 MG/ML IJ SOLN: 1.0000 mg | Freq: Once | INTRAMUSCULAR | Status: DC

## 2021-05-31 MED ORDER — LACTATED RINGERS IV SOLN: INTRAVENOUS | Status: DC

## 2021-05-31 MED ORDER — ONDANSETRON HCL 4 MG/2ML IJ SOLN: 4.0000 mg | Freq: Four times a day (QID) | INTRAMUSCULAR | Status: DC | PRN

## 2021-05-31 MED ORDER — FOLIC ACID 1 MG PO TABS
1.0000 mg | ORAL_TABLET | Freq: Every day | ORAL | Status: DC
  Administered 2021-05-31 – 2021-06-02 (×3): 1 mg via ORAL
  Filled 2021-05-31 (×3): qty 1

## 2021-05-31 MED ORDER — DEXAMETHASONE SODIUM PHOSPHATE 4 MG/ML IJ SOLN
INTRAMUSCULAR | Status: DC | PRN
  Administered 2021-05-31: 10 mg via INTRAVENOUS

## 2021-05-31 MED ORDER — METOCLOPRAMIDE HCL 5 MG PO TABS: 5.0000 mg | ORAL_TABLET | Freq: Three times a day (TID) | ORAL | Status: DC | PRN

## 2021-05-31 MED ORDER — METOCLOPRAMIDE HCL 5 MG/ML IJ SOLN: 5.0000 mg | Freq: Three times a day (TID) | INTRAMUSCULAR | Status: DC | PRN

## 2021-05-31 MED ORDER — PROPOFOL 10 MG/ML IV BOLUS
INTRAVENOUS | Status: AC
Start: 2021-05-31 — End: ?
  Filled 2021-05-31: qty 20

## 2021-05-31 MED ORDER — MORPHINE SULFATE (PF) 2 MG/ML IV SOLN: 0.5000 mg | INTRAVENOUS | Status: DC | PRN

## 2021-05-31 MED ORDER — SUGAMMADEX SODIUM 200 MG/2ML IV SOLN
INTRAVENOUS | Status: DC | PRN
  Administered 2021-05-31: 200 mg via INTRAVENOUS

## 2021-05-31 MED ORDER — MIDAZOLAM HCL 2 MG/2ML IJ SOLN
INTRAMUSCULAR | Status: AC
Start: 2021-05-31 — End: ?
  Filled 2021-05-31: qty 2

## 2021-05-31 MED ORDER — FENTANYL CITRATE (PF) 250 MCG/5ML IJ SOLN
INTRAMUSCULAR | Status: AC
  Filled 2021-05-31: qty 5

## 2021-05-31 MED ORDER — ORAL CARE MOUTH RINSE
15.0000 mL | Freq: Once | OROMUCOSAL | Status: AC
  Administered 2021-05-31: 15 mL via OROMUCOSAL

## 2021-05-31 MED ORDER — POVIDONE-IODINE 10 % EX SWAB
2.0000 "application " | Freq: Once | CUTANEOUS | Status: AC
  Administered 2021-05-31: 2 via TOPICAL

## 2021-05-31 MED ORDER — HYDRALAZINE HCL 20 MG/ML IJ SOLN
INTRAMUSCULAR | Status: DC | PRN
  Administered 2021-05-31: 2 mg via INTRAVENOUS
  Administered 2021-05-31: 3 mg via INTRAVENOUS

## 2021-05-31 MED ORDER — KCL IN DEXTROSE-NACL 20-5-0.45 MEQ/L-%-% IV SOLN
INTRAVENOUS | Status: DC
  Filled 2021-05-31: qty 1000

## 2021-05-31 MED ORDER — HYDROCODONE-ACETAMINOPHEN 5-325 MG PO TABS
1.0000 | ORAL_TABLET | Freq: Once | ORAL | Status: AC
  Administered 2021-05-31: 1 via ORAL
  Filled 2021-05-31: qty 1

## 2021-05-31 MED ORDER — DOCUSATE SODIUM 100 MG PO CAPS
100.0000 mg | ORAL_CAPSULE | Freq: Two times a day (BID) | ORAL | Status: DC
  Administered 2021-05-31 – 2021-06-02 (×4): 100 mg via ORAL
  Filled 2021-05-31 (×4): qty 1

## 2021-05-31 MED ORDER — OXYCODONE HCL 5 MG PO TABS: 5.0000 mg | ORAL_TABLET | ORAL | Status: DC | PRN

## 2021-05-31 MED ORDER — MIDAZOLAM HCL 5 MG/5ML IJ SOLN
INTRAMUSCULAR | Status: DC | PRN
  Administered 2021-05-31: 2 mg via INTRAVENOUS

## 2021-05-31 MED ORDER — PROPOFOL 10 MG/ML IV BOLUS
INTRAVENOUS | Status: DC | PRN
  Administered 2021-05-31: 40 mg via INTRAVENOUS
  Administered 2021-05-31: 150 mg via INTRAVENOUS

## 2021-05-31 MED ORDER — POVIDONE-IODINE 7.5 % EX SOLN
Freq: Once | CUTANEOUS | Status: DC
  Filled 2021-05-31: qty 118

## 2021-05-31 MED ORDER — ROCURONIUM BROMIDE 100 MG/10ML IV SOLN
INTRAVENOUS | Status: DC | PRN
  Administered 2021-05-31: 5 mg via INTRAVENOUS
  Administered 2021-05-31: 50 mg via INTRAVENOUS

## 2021-05-31 MED ORDER — THIAMINE HCL 100 MG PO TABS
100.0000 mg | ORAL_TABLET | Freq: Every day | ORAL | Status: DC
  Administered 2021-06-01 – 2021-06-02 (×2): 100 mg via ORAL
  Filled 2021-05-31 (×3): qty 1

## 2021-05-31 MED ORDER — HYDROCODONE-ACETAMINOPHEN 7.5-325 MG PO TABS
1.0000 | ORAL_TABLET | ORAL | Status: DC | PRN
  Administered 2021-06-01 – 2021-06-02 (×4): 2 via ORAL
  Filled 2021-05-31 (×4): qty 2

## 2021-05-31 MED ORDER — HYDROCODONE-ACETAMINOPHEN 5-325 MG PO TABS
1.0000 | ORAL_TABLET | ORAL | Status: DC | PRN
  Administered 2021-06-01 – 2021-06-02 (×2): 2 via ORAL
  Filled 2021-05-31 (×2): qty 2

## 2021-05-31 MED ORDER — 0.9 % SODIUM CHLORIDE (POUR BTL) OPTIME
TOPICAL | Status: DC | PRN
  Administered 2021-05-31: 1000 mL

## 2021-05-31 MED ORDER — LIDOCAINE HCL (CARDIAC) PF 50 MG/5ML IV SOSY
PREFILLED_SYRINGE | INTRAVENOUS | Status: DC | PRN
  Administered 2021-05-31: 60 mg via INTRAVENOUS

## 2021-05-31 MED ORDER — HYDRALAZINE HCL 20 MG/ML IJ SOLN
INTRAMUSCULAR | Status: AC
  Filled 2021-05-31: qty 1

## 2021-05-31 MED ORDER — ONDANSETRON HCL 4 MG/2ML IJ SOLN
INTRAMUSCULAR | Status: DC | PRN
  Administered 2021-05-31: 4 mg via INTRAVENOUS

## 2021-05-31 MED ORDER — HYDROMORPHONE HCL 1 MG/ML IJ SOLN
1.0000 mg | Freq: Once | INTRAMUSCULAR | Status: AC
  Administered 2021-05-31: 1 mg via INTRAVENOUS
  Filled 2021-05-31: qty 1

## 2021-05-31 MED ORDER — CHLORHEXIDINE GLUCONATE 0.12 % MT SOLN: 15.0000 mL | Freq: Once | OROMUCOSAL | Status: DC

## 2021-05-31 MED ORDER — TRANEXAMIC ACID-NACL 1000-0.7 MG/100ML-% IV SOLN
1000.0000 mg | INTRAVENOUS | Status: AC
  Administered 2021-05-31: 1000 mg via INTRAVENOUS
  Filled 2021-05-31: qty 100

## 2021-05-31 MED ORDER — SUCCINYLCHOLINE CHLORIDE 200 MG/10ML IV SOSY
PREFILLED_SYRINGE | INTRAVENOUS | Status: DC | PRN
  Administered 2021-05-31: 120 mg via INTRAVENOUS

## 2021-05-31 MED ORDER — POLYETHYLENE GLYCOL 3350 17 G PO PACK: 17.0000 g | PACK | Freq: Every day | ORAL | Status: DC | PRN

## 2021-05-31 MED ORDER — CEFAZOLIN SODIUM-DEXTROSE 2-4 GM/100ML-% IV SOLN
2.0000 g | INTRAVENOUS | Status: AC
  Administered 2021-05-31: 2 g via INTRAVENOUS
  Filled 2021-05-31: qty 100

## 2021-05-31 MED ORDER — FLEET ENEMA 7-19 GM/118ML RE ENEM: 1.0000 | ENEMA | Freq: Once | RECTAL | Status: DC | PRN

## 2021-05-31 MED ORDER — ENOXAPARIN SODIUM 40 MG/0.4ML IJ SOSY
40.0000 mg | PREFILLED_SYRINGE | INTRAMUSCULAR | Status: DC
  Administered 2021-06-01 – 2021-06-02 (×2): 40 mg via SUBCUTANEOUS
  Filled 2021-05-31 (×2): qty 0.4

## 2021-05-31 MED ORDER — ONDANSETRON HCL 4 MG PO TABS: 4.0000 mg | ORAL_TABLET | Freq: Four times a day (QID) | ORAL | Status: DC | PRN

## 2021-05-31 MED ORDER — ACETAMINOPHEN 325 MG PO TABS
650.0000 mg | ORAL_TABLET | Freq: Four times a day (QID) | ORAL | Status: DC | PRN
  Administered 2021-06-02: 650 mg via ORAL
  Filled 2021-05-31: qty 2

## 2021-05-31 MED ORDER — LACTATED RINGERS IV SOLN: INTRAVENOUS | Status: DC | PRN

## 2021-05-31 MED ORDER — FENTANYL CITRATE (PF) 100 MCG/2ML IJ SOLN: 25.0000 ug | INTRAMUSCULAR | Status: DC | PRN

## 2021-05-31 MED ORDER — PROPOFOL 10 MG/ML IV BOLUS
INTRAVENOUS | Status: AC
  Filled 2021-05-31: qty 20

## 2021-05-31 MED ORDER — BISACODYL 10 MG RE SUPP: 10.0000 mg | Freq: Every day | RECTAL | Status: DC | PRN

## 2021-05-31 MED ORDER — ADULT MULTIVITAMIN W/MINERALS CH
1.0000 | ORAL_TABLET | Freq: Every day | ORAL | Status: DC
  Administered 2021-05-31 – 2021-06-02 (×3): 1 via ORAL
  Filled 2021-05-31 (×3): qty 1

## 2021-05-31 MED ORDER — HYDRALAZINE HCL 10 MG PO TABS: 10.0000 mg | ORAL_TABLET | Freq: Three times a day (TID) | ORAL | Status: DC | PRN

## 2021-05-31 MED ORDER — FENTANYL CITRATE (PF) 100 MCG/2ML IJ SOLN
INTRAMUSCULAR | Status: DC | PRN
  Administered 2021-05-31: 100 ug via INTRAVENOUS
  Administered 2021-05-31 (×3): 50 ug via INTRAVENOUS

## 2021-05-31 MED ORDER — THIAMINE HCL 100 MG/ML IJ SOLN
100.0000 mg | Freq: Every day | INTRAMUSCULAR | Status: DC
  Administered 2021-05-31: 100 mg via INTRAVENOUS
  Filled 2021-05-31: qty 2

## 2021-05-31 SURGICAL SUPPLY — 40 items
BAG COUNTER SPONGE SURGICOUNT (BAG) ×2 IMPLANT
BIT DRILL CANN LG 4.3MM (BIT) IMPLANT
BLADE SURG 15 STRL LF DISP TIS (BLADE) ×1 IMPLANT
BLADE SURG 15 STRL SS (BLADE)
CANISTER SUCT 3000ML PPV (MISCELLANEOUS) ×2 IMPLANT
COVER MAYO STAND STRL (DRAPES) ×2 IMPLANT
COVER PERINEAL POST (MISCELLANEOUS) ×2 IMPLANT
COVER SURGICAL LIGHT HANDLE (MISCELLANEOUS) ×2 IMPLANT
DRAPE STERI IOBAN 125X83 (DRAPES) ×2 IMPLANT
DRAPE U-SHAPE 47X51 STRL (DRAPES) ×2 IMPLANT
DRESSING MEPILEX FLEX 4X4 (GAUZE/BANDAGES/DRESSINGS) IMPLANT
DRILL BIT CANN LG 4.3MM (BIT) ×2
DRSG MEPILEX BORDER 4X4 (GAUZE/BANDAGES/DRESSINGS) ×2 IMPLANT
DRSG MEPILEX BORDER 4X8 (GAUZE/BANDAGES/DRESSINGS) ×2 IMPLANT
DRSG MEPILEX FLEX 4X4 (GAUZE/BANDAGES/DRESSINGS) ×2
DURAPREP 26ML APPLICATOR (WOUND CARE) ×1 IMPLANT
ELECT REM PT RETURN 9FT ADLT (ELECTROSURGICAL) ×2
ELECTRODE REM PT RTRN 9FT ADLT (ELECTROSURGICAL) ×1 IMPLANT
GLOVE BIO SURGEON STRL SZ7 (GLOVE) ×2 IMPLANT
GLOVE BIO SURGEON STRL SZ8 (GLOVE) ×2 IMPLANT
GLOVE BIOGEL PI IND STRL 8 (GLOVE) ×1 IMPLANT
GLOVE BIOGEL PI INDICATOR 8 (GLOVE) ×1
GOWN STRL REUS W/ TWL LRG LVL3 (GOWN DISPOSABLE) ×1 IMPLANT
GOWN STRL REUS W/ TWL XL LVL3 (GOWN DISPOSABLE) ×2 IMPLANT
GOWN STRL REUS W/TWL LRG LVL3 (GOWN DISPOSABLE) ×2
GOWN STRL REUS W/TWL XL LVL3 (GOWN DISPOSABLE) ×4
GUIDEPIN VERSANAIL DSP 3.2X444 (ORTHOPEDIC DISPOSABLE SUPPLIES) ×1 IMPLANT
KIT BASIN OR (CUSTOM PROCEDURE TRAY) ×2 IMPLANT
KIT TURNOVER KIT B (KITS) ×2 IMPLANT
NAIL HIP FRACT 130D 11X180 (Screw) ×1 IMPLANT
NS IRRIG 1000ML POUR BTL (IV SOLUTION) ×2 IMPLANT
PACK GENERAL/GYN (CUSTOM PROCEDURE TRAY) ×2 IMPLANT
PAD ARMBOARD 7.5X6 YLW CONV (MISCELLANEOUS) ×4 IMPLANT
SCREW BONE CORTICAL 5.0X42 (Screw) ×1 IMPLANT
SCREW LAG 10.5MMX105MM HFN (Screw) ×1 IMPLANT
SPONGE T-LAP 18X18 ~~LOC~~+RFID (SPONGE) ×3 IMPLANT
STAPLER VISISTAT 35W (STAPLE) ×3 IMPLANT
SUT MNCRL AB 3-0 PS2 18 (SUTURE) ×2 IMPLANT
SUT VIC AB 0 CT1 27 (SUTURE) ×2
SUT VIC AB 0 CT1 27XBRD ANBCTR (SUTURE) ×1 IMPLANT

## 2021-05-31 NOTE — Anesthesia Procedure Notes (Signed)
Procedure Name: Intubation ?Date/Time: 05/31/2021 5:44 PM ?Performed by: Edmonia Caprio, CRNA ?Pre-anesthesia Checklist: Patient identified, Timeout performed, Patient being monitored, Suction available and Emergency Drugs available ?Patient Re-evaluated:Patient Re-evaluated prior to induction ?Oxygen Delivery Method: Circle system utilized ?Preoxygenation: Pre-oxygenation with 100% oxygen ?Induction Type: Rapid sequence and IV induction ?Laryngoscope Size: Hyacinth Meeker, 2 and 3 ?Grade View: Grade III ?Tube type: Oral ?Tube size: 7.5 mm ?Number of attempts: 3 ?Placement Confirmation: ETT inserted through vocal cords under direct vision, positive ETCO2 and breath sounds checked- equal and bilateral ?Secured at: 23 cm ?Tube secured with: Tape ?Dental Injury: Teeth and Oropharynx as per pre-operative assessment  ?Comments: DL x1 by Ruffin Frederick 2; able to lift epiglottis but only saw esophagus.  DL x2 by Stoltzfus Mil 2, then Mil 3 with view of cords.  Atraum intubation. +BBS +ETC02. ? ? ? ? ?
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
## 2021-05-31 NOTE — Anesthesia Postprocedure Evaluation (Signed)
Anesthesia Post Note ? ?Patient: Keith Deleon ? ?Procedure(s) Performed: INTRAMEDULLARY (IM) NAIL INTERTROCHANTRIC (Right: Hip) ? ?  ? ?Patient location during evaluation: PACU ?Anesthesia Type: General ?Level of consciousness: awake ?Pain management: pain level controlled ?Vital Signs Assessment: post-procedure vital signs reviewed and stable ?Respiratory status: spontaneous breathing ?Cardiovascular status: stable ?Postop Assessment: no apparent nausea or vomiting ?Anesthetic complications: no ? ? ?No notable events documented. ? ?Last Vitals:  ?Vitals:  ? 05/31/21 1930 05/31/21 1935  ?BP: (!) 158/90   ?Pulse: 95 91  ?Resp: (!) 27 (!) 35  ?Temp: 36.8 ?C   ?SpO2: 94% 95%  ?  ?Last Pain:  ?Vitals:  ? 05/31/21 1930  ?TempSrc:   ?PainSc: 0-No pain  ? ? ?  ?  ?  ?  ?  ?  ? ?Nainoa Woldt ? ? ? ? ?

## 2021-05-31 NOTE — Progress Notes (Signed)
Pacu RN Report to floor given ? ?Gave report to  [employer]. Room: 5N09.  ? ?Discussed surgery, meds given in OR and Pacu, VS, IV fluids given, EBL, urine output, pain and other pertinent information. Also discussed if pt had any family or friends here or belongings with them.  ? ?Discussed surgeon coming and placing new dressing of gauze and ace wrap.  ? ?Pt exits my care.   ?
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
## 2021-05-31 NOTE — Transfer of Care (Signed)
Immediate Anesthesia Transfer of Care Note ? ?Patient: Keith Deleon ? ?Procedure(s) Performed: INTRAMEDULLARY (IM) NAIL INTERTROCHANTRIC (Right: Hip) ? ?Patient Location: PACU ? ?Anesthesia Type:General ? ?Level of Consciousness: awake and alert  ? ?Airway & Oxygen Therapy: Patient Spontanous Breathing and Patient connected to nasal cannula oxygen ? ?Post-op Assessment: Report given to RN, Post -op Vital signs reviewed and stable and Patient moving all extremities ? ?Post vital signs: Reviewed and stable ? ?Last Vitals:  ?Vitals Value Taken Time  ?BP 158/90 05/31/21 1931  ?Temp    ?Pulse 91 05/31/21 1933  ?Resp 20 05/31/21 1936  ?SpO2 95 % 05/31/21 1933  ?Vitals shown include unvalidated device data. ? ?Last Pain:  ?Vitals:  ? 05/31/21 1646  ?TempSrc: Oral  ?PainSc:   ?   ? ?  ? ?Complications: No notable events documented. ?
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
## 2021-06-01 DIAGNOSIS — S72144A Nondisplaced intertrochanteric fracture of right femur, initial encounter for closed fracture: Secondary | ICD-10-CM

## 2021-06-01 LAB — COMPREHENSIVE METABOLIC PANEL
ALT: 64 U/L — ABNORMAL HIGH (ref 0–44)
AST: 53 U/L — ABNORMAL HIGH (ref 15–41)
Albumin: 3.2 g/dL — ABNORMAL LOW (ref 3.5–5.0)
Alkaline Phosphatase: 52 U/L (ref 38–126)
Anion gap: 7 (ref 5–15)
BUN: 12 mg/dL (ref 8–23)
CO2: 25 mmol/L (ref 22–32)
Calcium: 8.4 mg/dL — ABNORMAL LOW (ref 8.9–10.3)
Chloride: 103 mmol/L (ref 98–111)
Creatinine, Ser: 0.85 mg/dL (ref 0.61–1.24)
GFR, Estimated: >60 mL/min (ref >60)
Glucose, Bld: 174 mg/dL — ABNORMAL HIGH (ref 70–99)
Potassium: 4.4 mmol/L (ref 3.5–5.1)
Sodium: 135 mmol/L (ref 135–145)
Total Bilirubin: 1 mg/dL (ref 0.3–1.2)
Total Protein: 6.6 g/dL (ref 6.5–8.1)

## 2021-06-01 LAB — HEMOGLOBIN A1C
Hgb A1c MFr Bld: 6.3 % — ABNORMAL HIGH (ref 4.8–5.6)
Mean Plasma Glucose: 134.11 mg/dL

## 2021-06-01 LAB — PHOSPHORUS: Phosphorus: 2.4 mg/dL — ABNORMAL LOW (ref 2.5–4.6)

## 2021-06-01 LAB — CBC
HCT: 33.6 % — ABNORMAL LOW (ref 39.0–52.0)
Hemoglobin: 11.4 g/dL — ABNORMAL LOW (ref 13.0–17.0)
MCH: 27.4 pg (ref 26.0–34.0)
MCHC: 33.9 g/dL (ref 30.0–36.0)
MCV: 80.8 fL (ref 80.0–100.0)
Platelets: 182 10*3/uL (ref 150–400)
RBC: 4.16 MIL/uL — ABNORMAL LOW (ref 4.22–5.81)
RDW: 15.3 % (ref 11.5–15.5)
WBC: 8 10*3/uL (ref 4.0–10.5)
nRBC: 0 % (ref 0.0–0.2)

## 2021-06-01 LAB — VITAMIN B12: Vitamin B-12: 322 pg/mL (ref 180–914)

## 2021-06-01 LAB — GLUCOSE, CAPILLARY
Glucose-Capillary: 215 mg/dL — ABNORMAL HIGH (ref 70–99)
Glucose-Capillary: 211 mg/dL — ABNORMAL HIGH (ref 70–99)

## 2021-06-01 LAB — MAGNESIUM: Magnesium: 1.8 mg/dL (ref 1.7–2.4)

## 2021-06-01 MED ORDER — INSULIN ASPART 100 UNIT/ML IJ SOLN
0.0000 [IU] | Freq: Three times a day (TID) | INTRAMUSCULAR | Status: DC
  Administered 2021-06-01: 3 [IU] via SUBCUTANEOUS
  Administered 2021-06-02: 1 [IU] via SUBCUTANEOUS

## 2021-06-01 MED ORDER — K PHOS MONO-SOD PHOS DI & MONO 155-852-130 MG PO TABS
250.0000 mg | ORAL_TABLET | Freq: Once | ORAL | Status: AC
  Administered 2021-06-01: 250 mg via ORAL
  Filled 2021-06-01: qty 1
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
## 2021-06-01 NOTE — Progress Notes (Signed)
Patient ID: Keith Deleon, male   DOB: [birth year], 65 y.o.   MRN: [id number] ?Subjective: ?1 Day Post-Op Procedure(s) (LRB): ?INTRAMEDULLARY (IM) NAIL INTERTROCHANTRIC (Right) ?  ? ?Patient reports pain as mild to moderate though no OOB activity yet ? ?Objective:  ? ?VITALS:   ?Vitals:  ? 05/31/21 2053 06/01/21 0426  ?BP: (!) 150/82 (!) 169/85  ?Pulse: 79 76  ?Resp: 16 16  ?Temp: 97.8 ?F (36.6 ?C) 98.8 ?F (37.1 ?C)  ?SpO2: 97% 100%  ? ? ?Neurovascular intact ?Incision: dressing C/D/I ? ?LABS ?Recent Labs  ?  05/31/21 ?1310 06/01/21 ?0209  ?HGB 12.6* 11.4*  ?HCT 39.3 33.6*  ?WBC 8.6 8.0  ?PLT 184 182  ? ? ?Recent Labs  ?  05/31/21 ?1310 06/01/21 ?0209  ?NA 136 135  ?K 4.3 4.4  ?BUN 12 12  ?CREATININE 0.88 0.85  ?GLUCOSE 129* 174*  ? ? ?No results for input(s): LABPT, INR in the last 72 hours. ? ? ?Assessment/Plan: ?1 Day Post-Op Procedure(s) (LRB): ?INTRAMEDULLARY (IM) NAIL INTERTROCHANTRIC (Right) ? ? 

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
## 2021-06-02 ENCOUNTER — Encounter (HOSPITAL_COMMUNITY): Payer: Self-pay | Admitting: Orthopedic Surgery

## 2021-06-02 DIAGNOSIS — S72141A Displaced intertrochanteric fracture of right femur, initial encounter for closed fracture: Principal | ICD-10-CM

## 2021-06-02 LAB — COMPREHENSIVE METABOLIC PANEL
ALT: 50 U/L — ABNORMAL HIGH (ref 0–44)
AST: 49 U/L — ABNORMAL HIGH (ref 15–41)
Albumin: 2.9 g/dL — ABNORMAL LOW (ref 3.5–5.0)
Alkaline Phosphatase: 42 U/L (ref 38–126)
Anion gap: 5 (ref 5–15)
BUN: 21 mg/dL (ref 8–23)
CO2: 24 mmol/L (ref 22–32)
Calcium: 7.9 mg/dL — ABNORMAL LOW (ref 8.9–10.3)
Chloride: 105 mmol/L (ref 98–111)
Creatinine, Ser: 0.96 mg/dL (ref 0.61–1.24)
GFR, Estimated: >60 mL/min (ref >60)
Glucose, Bld: 262 mg/dL — ABNORMAL HIGH (ref 70–99)
Potassium: 3.4 mmol/L — ABNORMAL LOW (ref 3.5–5.1)
Sodium: 134 mmol/L — ABNORMAL LOW (ref 135–145)
Total Bilirubin: 0.7 mg/dL (ref 0.3–1.2)
Total Protein: 5.9 g/dL — ABNORMAL LOW (ref 6.5–8.1)

## 2021-06-02 LAB — CBC
HCT: 29.6 % — ABNORMAL LOW (ref 39.0–52.0)
Hemoglobin: 9.9 g/dL — ABNORMAL LOW (ref 13.0–17.0)
MCH: 27.7 pg (ref 26.0–34.0)
MCHC: 33.4 g/dL (ref 30.0–36.0)
MCV: 82.9 fL (ref 80.0–100.0)
Platelets: 155 10*3/uL (ref 150–400)
RBC: 3.57 MIL/uL — ABNORMAL LOW (ref 4.22–5.81)
RDW: 15.4 % (ref 11.5–15.5)
WBC: 10.7 10*3/uL — ABNORMAL HIGH (ref 4.0–10.5)
nRBC: 0 % (ref 0.0–0.2)

## 2021-06-02 LAB — GLUCOSE, CAPILLARY
Glucose-Capillary: 160 mg/dL — ABNORMAL HIGH (ref 70–99)
Glucose-Capillary: 132 mg/dL — ABNORMAL HIGH (ref 70–99)
Glucose-Capillary: 133 mg/dL — ABNORMAL HIGH (ref 70–99)

## 2021-06-02 MED ORDER — SENNA 8.6 MG PO TABS: 2.0000 | ORAL_TABLET | Freq: Two times a day (BID) | ORAL | 0 refills | Status: AC

## 2021-06-02 MED ORDER — DOCUSATE SODIUM 100 MG PO CAPS: 100.0000 mg | ORAL_CAPSULE | Freq: Two times a day (BID) | ORAL | 0 refills | Status: AC

## 2021-06-02 MED ORDER — OXYCODONE HCL 5 MG PO TABS: 5.0000 mg | ORAL_TABLET | ORAL | 0 refills | Status: AC | PRN

## 2021-06-02 MED ORDER — POTASSIUM CHLORIDE 20 MEQ PO PACK
40.0000 meq | PACK | Freq: Once | ORAL | Status: AC
  Administered 2021-06-02: 40 meq via ORAL
  Filled 2021-06-02: qty 2

## 2021-06-02 MED ORDER — THIAMINE HCL 100 MG PO TABS: 100.0000 mg | ORAL_TABLET | Freq: Every day | ORAL | 1 refills | Status: AC

## 2021-06-02 MED ORDER — ADULT MULTIVITAMIN W/MINERALS CH: 1.0000 | ORAL_TABLET | Freq: Every day | ORAL | 1 refills | Status: AC

## 2021-06-02 MED ORDER — ASPIRIN EC 81 MG PO TBEC: 81.0000 mg | DELAYED_RELEASE_TABLET | Freq: Two times a day (BID) | ORAL | 0 refills | Status: AC
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
## 2021-06-02 NOTE — Discharge Instructions (Addendum)
Toni Arthurs, MD ?[facility] ? ?Please read the following information regarding your care after surgery. ? ?Medications  ?You only need a prescription for the narcotic pain medicine (ex. oxycodone, Percocet, Norco).  All of the other medicines listed below are available over the counter. ?? Aleve 2 pills twice a day for the first 3 days after surgery. ?? acetominophen (Tylenol) 650 mg every 4-6 hours as you need for minor to moderate pain ?? oxycodone as prescribed for severe pain ? ?Narcotic pain medicine (ex. oxycodone, Percocet, Vicodin) will cause constipation.  To prevent this problem, take the following medicines while you are taking any pain medicine. ?? docusate sodium (Colace) 100 mg twice a day ? senna (Senokot) 2 tablets twice a day ? ?? To help prevent blood clots, take a baby aspirin (81 mg) twice a day for six weeks after surgery.  You should also get up every hour while you are awake to move around.   ? ?Weight Bearing ?? Bear weight when you are able on your operated leg or foot with rolling walker. ? ? ?Cast / Splint / Dressing ?? Keep your splint, cast or dressing clean and dry.  Don?t put anything (coat hanger, pencil, etc) down inside of it.  If it gets damp, use a hair dryer on the cool setting to dry it.  If it gets soaked, call the office to schedule an appointment for a cast change. ? ? ?After your dressing, cast or splint is removed; you may shower, but do not soak or scrub the wound.  Allow the water to run over it, and then gently pat it dry. ? ?Swelling ?It is normal for you to have swelling where you had surgery.  To reduce swelling and pain, keep your toes above your nose for at least 3 days after surgery.  It may be necessary to keep your foot or leg elevated for several weeks.  If it hurts, it should be elevated. ? ?Follow Up ?Call my office at [phone] when you are discharged from the hospital or surgery center to schedule an appointment to be seen two weeks after  surgery. ? ?Call my office at [phone] if you develop a fever >101.5? F, nausea, vomiting, bleeding from the surgical site or severe pain.   ? ?Call [facility] [phone]) for a 1 week hospital follow-up and to establish care with them.  ?  ?
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
## 2021-06-02 NOTE — TOC CAGE-AID Note (Signed)
Transition of Care (TOC) - CAGE-AID Screening ? ? ?Patient Details  ?Name: BHARGAV BARBARO ?MRN: [id number] ?Date of Birth: [birth year] ? ?Transition of Care (TOC) CM/SW Contact:    ?Bess Kinds, RN ?Phone Number: [phone] ?06/02/2021, 10:08 AM ? ? ?Clinical Narrative: ? ?Patient reports that he drinks about 40 oz beer a day about 3 days a week depending on if he has money. Discussed desire to quit and offered resources. Patient declined at this time.  ? ?CAGE-AID Screening: ?Substance Abuse Screening unable to be completed due to: : Patient Refused ? ?Have You Ever Felt You Ought to Cut Down on Your Drinking or Drug Use?: Yes ?Have People Annoyed You By Critizing Your Drinking Or Drug Use?: No ?Have You Felt Bad Or Guilty About Your Drinking Or Drug Use?: No ?Have You Ever Had a Drink or Used Drugs First Thing In The Morning to Steady Your Nerves or to Get Rid of a Hangover?: No ?CAGE-AID Score: 1 ? ?Substance Abuse Education Offered: Yes ? ?Substance abuse interventions: Patient Counseling ? ? ? ? ? ? ?

## 2021-06-02 NOTE — TOC Transition Note (Signed)
Transition of Care (TOC) - CM/SW Discharge Note ? ? ?Patient Details  ?Name: MANFORD SPRONG ?MRN: [id number] ?Date of Birth: [birth year] ? ?Transition of Care (TOC) CM/SW Contact:  ?Bess Kinds, RN ?Phone Number: [phone] ?06/02/2021, 10:03 AM ? ? ?Clinical Narrative:    ? ?Spoke with patient at the bedside to discuss post acute transition. PTA home with siblings. Has DME that belonged to a family member that was given to him to include wheelchair, hospital bed, walker, and bsc. Will have transportation home.  ? ?Does not have PCP. Agreeable to receiving PCP information. Discussed [facility] who can also assist with transportation for appointments. Information provided on AVS and advised patient to call today to schedule an appointment.  ? ?Patient is agreeable to [facility] PT/OT. Agency choice offered. Referral accepted by [facility]. Patient will need [facility] PT/OT orders with Face to Face.  ? ?No further TOC needs identified at this time.  ? ?Final next level of care: Home w Home Health Services ?Barriers to Discharge: No Barriers Identified ? ? ?Patient Goals and CMS Choice ?Patient states their goals for this hospitalization and ongoing recovery are:: return home with family ?CMS Medicare.gov Compare Post Acute Care list provided to:: Patient ?Choice offered to / list presented to : Patient ? ?Discharge Placement ?  ?           ?  ?  ?  ?  ? ?Discharge Plan and Services ?  ?Discharge Planning Services: CM Consult ?Post Acute Care Choice: Home Health          ?DME Arranged: N/A ?DME Agency: NA ?  ?  ?  ?HH Arranged: PT, OT ?HH Agency: [facility] ?Date HH Agency Contacted: 06/02/21 ?Time HH Agency Contacted: 1003 ?Representative spoke with at [facility] Agency: Kandee Keen ? ?Social Determinants of Health (SDOH) Interventions ?  ? ? ?Readmission Risk Interventions ?   ? View : No data to display.  ?  ?  ?  ? ? ? ? ? ?

## 2021-06-02 NOTE — Progress Notes (Signed)
Subjective: ?2 Days Post-Op Procedure(s) (LRB): ?INTRAMEDULLARY (IM) NAIL INTERTROCHANTRIC (Right) ? ?Patient reports pain as mild to moderate.  Resting comfortably in bed.  Denies fever, chills, N/V, CP, SOB.  Tolerating POs well.  Admits to flatus.  Notes that he has worked well with therapy.  Expresses desire to go home. ? ?Objective:  ? ?VITALS:  Temp:  [98.2 ?F (36.8 ?C)-98.5 ?F (36.9 ?C)] 98.2 ?F (36.8 ?C) (05/08 7672) ?Pulse Rate:  [69-72] 69 (05/08 0724) ?Resp:  [17-18] 17 (05/08 0724) ?BP: (155-172)/(87-97) 155/91 (05/08 0724) ?SpO2:  [97 %-100 %] 98 % (05/08 0724) ? ?General: WDWN patient in NAD. ?Psych:  Appropriate mood and affect. ?Neuro:  A&O x 3, Moving all extremities, sensation intact to light touch ?HEENT:  EOMs intact ?Chest:  Even non-labored respirations ?Skin: Dressing C/D/I, no rashes or lesions ?Extremities: warm/dry, mild edema to R hip, no erythema or echymosis.  No lymphadenopathy. ?Pulses: Popliteus 2+ ?MSK:  ROM: TKE , MMT: able to perform quad set, (-) Homan's  ? ? ?LABS ?Recent Labs  ?  05/31/21 ?1310 06/01/21 ?0209 06/02/21 ?0947  ?HGB 12.6* 11.4* 9.9*  ?WBC 8.6 8.0 10.7*  ?PLT 184 182 155  ? ?Recent Labs  ?  06/01/21 ?0209 06/02/21 ?0962  ?NA 135 134*  ?K 4.4 3.4*  ?CL 103 105  ?CO2 25 24  ?BUN 12 21  ?CREATININE 0.85 0.96  ?GLUCOSE 174* 262*  ? ?No results for input(s): LABPT, INR in the last 72 hours. ? ? ?Assessment/Plan: ?2 Days Post-Op Procedure(s) (LRB): ?INTRAMEDULLARY (IM) NAIL INTERTROCHANTRIC (Right) ? ?WBAT R LE ?Up with therapy ?Disp:  PT recommends Home with HHPT. ?D/C scripts sent to patient's pharmacy.  After searching [place] PMP Aware the patient is provided a Rx for oxycodone. ?DVT ppx:  Lovenox in house, transition to 81mg  ASA po BID upon D/C ?Plan for 2 week outpatient post-op visit. ? ? PA-C ?[facility] ?Office:  [phone]  ?

## 2021-06-03 ENCOUNTER — Encounter (HOSPITAL_COMMUNITY): Payer: Self-pay | Admitting: Orthopedic Surgery

## 2021-06-03 LAB — VITAMIN B1: Vitamin B1 (Thiamine): 178.5 nmol/L (ref 66.5–200.0)

## 2021-07-15 ENCOUNTER — Other Ambulatory Visit: Payer: Self-pay | Admitting: Internal Medicine

## 2021-07-15 DIAGNOSIS — Z131 Encounter for screening for diabetes mellitus: Secondary | ICD-10-CM | POA: Diagnosis not present

## 2021-07-15 DIAGNOSIS — S72141D Displaced intertrochanteric fracture of right femur, subsequent encounter for closed fracture with routine healing: Secondary | ICD-10-CM | POA: Diagnosis not present

## 2021-07-15 DIAGNOSIS — Z13220 Encounter for screening for lipoid disorders: Secondary | ICD-10-CM | POA: Diagnosis not present

## 2021-07-15 DIAGNOSIS — M10051 Idiopathic gout, right hip: Secondary | ICD-10-CM | POA: Diagnosis not present

## 2021-07-15 DIAGNOSIS — E559 Vitamin D deficiency, unspecified: Secondary | ICD-10-CM | POA: Diagnosis not present

## 2021-07-15 DIAGNOSIS — I10 Essential (primary) hypertension: Secondary | ICD-10-CM | POA: Diagnosis not present

## 2021-07-15 DIAGNOSIS — F1010 Alcohol abuse, uncomplicated: Secondary | ICD-10-CM | POA: Diagnosis not present

## 2021-07-15 DIAGNOSIS — Z0000 Encounter for general adult medical examination without abnormal findings: Secondary | ICD-10-CM | POA: Diagnosis not present

## 2021-07-15 DIAGNOSIS — Z125 Encounter for screening for malignant neoplasm of prostate: Secondary | ICD-10-CM | POA: Diagnosis not present

## 2021-07-16 LAB — COMPLETE METABOLIC PANEL WITH GFR
AG Ratio: 1.2 (calc) (ref 1.0–2.5)
ALT: 77 U/L — ABNORMAL HIGH (ref 9–46)
AST: 58 U/L — ABNORMAL HIGH (ref 10–35)
Albumin: 3.9 g/dL (ref 3.6–5.1)
Alkaline phosphatase (APISO): 93 U/L (ref 35–144)
BUN: 18 mg/dL (ref 7–25)
CO2: 22 mmol/L (ref 20–32)
Calcium: 9.3 mg/dL (ref 8.6–10.3)
Chloride: 107 mmol/L (ref 98–110)
Creat: 0.87 mg/dL (ref 0.70–1.35)
Globulin: 3.2 g/dL (calc) (ref 1.9–3.7)
Glucose, Bld: 91 mg/dL (ref 65–99)
Potassium: 4.4 mmol/L (ref 3.5–5.3)
Sodium: 141 mmol/L (ref 135–146)
Total Bilirubin: 0.4 mg/dL (ref 0.2–1.2)
Total Protein: 7.1 g/dL (ref 6.1–8.1)
eGFR: 95 mL/min/{1.73_m2} (ref >=60)

## 2021-07-16 LAB — CBC
HCT: 41.6 % (ref 38.5–50.0)
Hemoglobin: 13.8 g/dL (ref 13.2–17.1)
MCH: 27.6 pg (ref 27.0–33.0)
MCHC: 33.2 g/dL (ref 32.0–36.0)
MCV: 83.2 fL (ref 80.0–100.0)
MPV: 10.3 fL (ref 7.5–12.5)
Platelets: 284 10*3/uL (ref 140–400)
RBC: 5 10*6/uL (ref 4.20–5.80)
RDW: 14 % (ref 11.0–15.0)
WBC: 4.5 10*3/uL (ref 3.8–10.8)

## 2021-07-16 LAB — LIPID PANEL
Cholesterol: 156 mg/dL (ref <200)
HDL: 78 mg/dL (ref >=40)
LDL Cholesterol (Calc): 65 mg/dL (calc)
Non-HDL Cholesterol (Calc): 78 mg/dL (calc) (ref <130)
Total CHOL/HDL Ratio: 2 (calc) (ref <5.0)
Triglycerides: 46 mg/dL (ref <150)

## 2021-07-16 LAB — VITAMIN D 25 HYDROXY (VIT D DEFICIENCY, FRACTURES): Vit D, 25-Hydroxy: 15 ng/mL — ABNORMAL LOW (ref 30–100)

## 2021-07-16 LAB — TSH: TSH: 0.96 mIU/L (ref 0.40–4.50)

## 2021-07-16 LAB — PSA: PSA: 18.19 ng/mL — ABNORMAL HIGH (ref <=4.00)

## 2021-07-16 LAB — URIC ACID: Uric Acid, Serum: 6.6 mg/dL (ref 4.0–8.0)

## 2021-08-18 DIAGNOSIS — Z4789 Encounter for other orthopedic aftercare: Secondary | ICD-10-CM | POA: Diagnosis not present

## 2021-08-18 DIAGNOSIS — S72141D Displaced intertrochanteric fracture of right femur, subsequent encounter for closed fracture with routine healing: Secondary | ICD-10-CM | POA: Diagnosis not present

## 2021-10-16 DIAGNOSIS — R972 Elevated prostate specific antigen [PSA]: Secondary | ICD-10-CM | POA: Diagnosis not present

## 2021-10-16 DIAGNOSIS — Z23 Encounter for immunization: Secondary | ICD-10-CM | POA: Diagnosis not present

## 2021-10-16 DIAGNOSIS — F1010 Alcohol abuse, uncomplicated: Secondary | ICD-10-CM | POA: Diagnosis not present

## 2021-10-16 DIAGNOSIS — I10 Essential (primary) hypertension: Secondary | ICD-10-CM | POA: Diagnosis not present

## 2023-05-19 DIAGNOSIS — F17290 Nicotine dependence, other tobacco product, uncomplicated: Secondary | ICD-10-CM | POA: Diagnosis not present

## 2023-05-19 DIAGNOSIS — M150 Primary generalized (osteo)arthritis: Secondary | ICD-10-CM | POA: Diagnosis not present

## 2023-05-19 DIAGNOSIS — R7303 Prediabetes: Secondary | ICD-10-CM | POA: Diagnosis not present

## 2023-05-19 DIAGNOSIS — Z136 Encounter for screening for cardiovascular disorders: Secondary | ICD-10-CM | POA: Diagnosis not present

## 2023-05-19 DIAGNOSIS — I10 Essential (primary) hypertension: Secondary | ICD-10-CM | POA: Diagnosis not present

## 2023-05-19 DIAGNOSIS — E559 Vitamin D deficiency, unspecified: Secondary | ICD-10-CM | POA: Diagnosis not present

## 2023-05-19 DIAGNOSIS — Z79899 Other long term (current) drug therapy: Secondary | ICD-10-CM | POA: Diagnosis not present

## 2023-05-19 DIAGNOSIS — Z0189 Encounter for other specified special examinations: Secondary | ICD-10-CM | POA: Diagnosis not present

## 2023-05-19 DIAGNOSIS — Z1159 Encounter for screening for other viral diseases: Secondary | ICD-10-CM | POA: Diagnosis not present

## 2023-06-14 ENCOUNTER — Other Ambulatory Visit: Payer: Self-pay | Admitting: Nurse Practitioner

## 2023-06-14 ENCOUNTER — Encounter: Payer: Self-pay | Admitting: Nurse Practitioner

## 2023-06-14 DIAGNOSIS — F1090 Alcohol use, unspecified, uncomplicated: Secondary | ICD-10-CM

## 2023-06-14 DIAGNOSIS — R972 Elevated prostate specific antigen [PSA]: Secondary | ICD-10-CM

## 2023-09-13 ENCOUNTER — Emergency Department (HOSPITAL_COMMUNITY)

## 2023-09-13 ENCOUNTER — Emergency Department (HOSPITAL_COMMUNITY): Admission: EM | Admit: 2023-09-13 | Discharge: 2023-09-13 | Disposition: A | Discharge status: Home / Self Care

## 2023-09-13 ENCOUNTER — Other Ambulatory Visit: Payer: Self-pay

## 2023-09-13 DIAGNOSIS — Z7982 Long term (current) use of aspirin: Secondary | ICD-10-CM | POA: Diagnosis not present

## 2023-09-13 DIAGNOSIS — M109 Gout, unspecified: Secondary | ICD-10-CM | POA: Insufficient documentation

## 2023-09-13 DIAGNOSIS — M25561 Pain in right knee: Secondary | ICD-10-CM | POA: Diagnosis present

## 2023-09-13 LAB — CBG MONITORING, ED: Glucose-Capillary: 113 mg/dL — ABNORMAL HIGH (ref 70–99)

## 2023-09-13 MED ORDER — DEXAMETHASONE SODIUM PHOSPHATE 10 MG/ML IJ SOLN
10.0000 mg | Freq: Once | INTRAMUSCULAR | Status: AC
Start: 2023-09-13 — End: 2023-09-13
  Administered 2023-09-13: 10 mg via INTRAMUSCULAR
  Filled 2023-09-13: qty 1

## 2023-09-13 MED ORDER — OXYCODONE-ACETAMINOPHEN 5-325 MG PO TABS
1.0000 | ORAL_TABLET | Freq: Once | ORAL | Status: AC
  Administered 2023-09-13: 1 via ORAL
  Filled 2023-09-13: qty 1

## 2023-09-13 MED ORDER — KETOROLAC TROMETHAMINE 60 MG/2ML IM SOLN
30.0000 mg | Freq: Once | INTRAMUSCULAR | Status: AC
  Administered 2023-09-13: 30 mg via INTRAMUSCULAR
  Filled 2023-09-13: qty 2

## 2023-09-13 NOTE — ED Provider Notes (Signed)
 [place] EMERGENCY DEPARTMENT AT [facility] Provider Note   CSN: [id number] Arrival date & time: 09/13/23  1819     Patient presents with: Knee Pain   Keith Deleon is a 68 y.o. male.  Who presents emergency department with chief complaint of right knee pain.  He has a past medical history of alcoholism and gout.  Patient states Doc, its my gout.  He complains of onset of knee pain and swelling yesterday.  He reports this is exactly the same as his usual gout flare.  He states the pain is severe and is having trouble ambulating.  He denies fevers chills or other joint involvement.    Knee Pain      Prior to Admission medications   Medication Sig Start Date End Date Taking? Authorizing Provider  aspirin  EC 81 MG tablet Take 1 tablet (81 mg total) by mouth 2 (two) times daily. 06/02/21   Aniceto Eva Grebe, PA-C  docusate sodium  (COLACE) 100 MG capsule Take 1 capsule (100 mg total) by mouth 2 (two) times daily. While taking narcotic pain medicine. 06/02/21   Aniceto Eva Grebe, PA-C  Multiple Vitamin (MULTIVITAMIN WITH MINERALS) TABS tablet Take 1 tablet by mouth daily. 06/03/21   Lou Claretta HERO, MD  oxyCODONE  (ROXICODONE ) 5 MG immediate release tablet Take 1 tablet (5 mg total) by mouth every 4 (four) hours as needed for severe pain. 06/02/21   Aniceto Eva Grebe, PA-C  senna (SENOKOT) 8.6 MG TABS tablet Take 2 tablets (17.2 mg total) by mouth 2 (two) times daily. 06/02/21   Aniceto Eva Grebe, PA-C  thiamine  100 MG tablet Take 1 tablet (100 mg total) by mouth daily. 06/03/21   Lou Claretta HERO, MD    Allergies: Chlorhexidine  gluconate and Penicillins    Review of Systems  Updated Vital Signs BP (!) 180/83 (BP Location: Left Arm)   Pulse (!) 58   Temp 99.2 F (37.3 C) (Oral)   Resp 20   Ht 5' 9 (1.753 m)   Wt 72.6 kg   SpO2 100%   BMI 23.63 kg/m   Physical Exam Vitals and nursing note reviewed.  Constitutional:      General: He is not in acute distress.     Appearance: He is well-developed. He is not diaphoretic.  HENT:     Head: Normocephalic and atraumatic.  Eyes:     General: No scleral icterus.    Conjunctiva/sclera: Conjunctivae normal.  Cardiovascular:     Rate and Rhythm: Normal rate and regular rhythm.     Heart sounds: Normal heart sounds.  Pulmonary:     Effort: Pulmonary effort is normal. No respiratory distress.     Breath sounds: Normal breath sounds.  Abdominal:     Palpations: Abdomen is soft.     Tenderness: There is no abdominal tenderness.  Musculoskeletal:     Cervical back: Normal range of motion and neck supple.     Comments: Right knee with large warm effusion.  No erythema over the knee.  He is able to range the knee but has limited range of motion due to pain.  Ipsilateral hip and ankle exam within normal limits.  DP pulse 2+.  Skin:    General: Skin is warm and dry.  Neurological:     Mental Status: He is alert.  Psychiatric:        Behavior: Behavior normal.     (all labs ordered are listed, but only abnormal results are displayed) Labs Reviewed  CBG MONITORING, ED    EKG: None  Radiology: DG Knee Complete 4 Views Right Result Date: 09/13/2023 CLINICAL DATA:  Knee pain, history of gout EXAM: RIGHT KNEE - COMPLETE 4+ VIEW COMPARISON:  Knee radiograph December 24, 2018 FINDINGS: No evidence of fracture, dislocation, or joint effusion. No evidence of arthropathy or other focal bone abnormality. Soft tissues are unremarkable. IMPRESSION: There is severe marginal osseous spurring consistent with osteoarthritic degenerative changes. Subchondral cystic changes/geodes are identified. There is severe narrowing of tibiofemoral joint compartment predominantly medial tibiofemoral joint. No acute fracture, malalignment or dislocation. There is mild varus deformity . Fullness of suprapatellar space suggestive of joint effusion/synovial thickening. Coarse amorphous calcification in suprapatellar bursa suggestive of  osteochondral bodies. Metallic density foreign body in soft tissue posterior lower thigh. Severe arthropathy right knee joint consistent with history of gout disease, similar to prior exam. Electronically Signed   By: Megan  Zare M.D.   On: 09/13/2023 19:18     Procedures   Medications Ordered in the ED  ketorolac  (TORADOL ) injection 30 mg (30 mg Intramuscular Given 09/13/23 1946)  oxyCODONE -acetaminophen  (PERCOCET/ROXICET) 5-325 MG per tablet 1 tablet (1 tablet Oral Given 09/13/23 1946)                                    Medical Decision Making Amount and/or Complexity of Data Reviewed Radiology: ordered.  Risk Prescription drug management.   Paitent here with c/o R knee pain and non-traumatic effusion DDX includes-osteoarthritis, crystal arthropathy such as gout or pseudogout, reactive arthritis, Lyme arthritis, early RA.  Have extremely low suspicion for septic arthritis.  Patient does not have a history of coag coagulopathy or blood thinning have lower suspicion for spontaneous hemarthrosis.  The patient's extensive history of gout and recurrent episodes of gout in this knee most likely diagnosis at this time is gout.  Patient is afebrile.  Patient given injection of Toradol  Decadron  and oral oxycodone  medications.  Patient had significant relief of his pain.  Given crutches.  He feels safe to go home at this time.  Appropriate for discharge with strict outpatient follow-up and return precautions.    Final diagnoses:  Acute gout of right knee, unspecified cause    ED Discharge Orders     None          Arloa Chroman, PA-C 09/13/23 2314    Kammerer, Megan L, DO 09/14/23 [phone]
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
## 2023-09-13 NOTE — ED Notes (Signed)
 Patient transported to X-ray

## 2023-09-13 NOTE — Discharge Instructions (Signed)
Contact a health care provider if you have: Another gout attack. Continuing symptoms of a gout attack after 10 days of treatment. Side effects from your medicines. Chills or a fever. Burning pain when you urinate. Pain in your lower back or abdomen. Get help right away if you: Have severe or uncontrolled pain. Cannot urinate. 

## 2023-09-13 NOTE — ED Notes (Signed)
 Patients returned from XR
